# Patient Record
Sex: Female | Born: 1945 | Race: White | Hispanic: No | Smoking: Never smoker
Health system: Southern US, Community
[De-identification: ages and names within clinical notes are randomized; demographics above are authoritative.]

## PROBLEM LIST (undated history)

## (undated) DIAGNOSIS — I10 Essential (primary) hypertension: Secondary | ICD-10-CM

## (undated) DIAGNOSIS — M1990 Unspecified osteoarthritis, unspecified site: Secondary | ICD-10-CM

## (undated) DIAGNOSIS — F419 Anxiety disorder, unspecified: Secondary | ICD-10-CM

## (undated) DIAGNOSIS — N3010 Interstitial cystitis (chronic) without hematuria: Secondary | ICD-10-CM

## (undated) DIAGNOSIS — K219 Gastro-esophageal reflux disease without esophagitis: Secondary | ICD-10-CM

## (undated) DIAGNOSIS — I219 Acute myocardial infarction, unspecified: Secondary | ICD-10-CM

## (undated) DIAGNOSIS — R112 Nausea with vomiting, unspecified: Secondary | ICD-10-CM

## (undated) DIAGNOSIS — Z9889 Other specified postprocedural states: Secondary | ICD-10-CM

## (undated) HISTORY — PX: APPENDECTOMY: SHX54

## (undated) HISTORY — PX: TONSILLECTOMY: SUR1361

## (undated) HISTORY — DX: Essential (primary) hypertension: I10

## (undated) HISTORY — DX: Gastro-esophageal reflux disease without esophagitis: K21.9

## (undated) HISTORY — DX: Acute myocardial infarction, unspecified: I21.9

## (undated) HISTORY — DX: Interstitial cystitis (chronic) without hematuria: N30.10

## (undated) HISTORY — PX: CHOLECYSTECTOMY: SHX55

## (undated) HISTORY — DX: Anxiety disorder, unspecified: F41.9

## (undated) HISTORY — PX: OTHER SURGICAL HISTORY: SHX169

---

## 2007-04-25 HISTORY — PX: OTHER SURGICAL HISTORY: SHX169

## 2008-04-24 DIAGNOSIS — I219 Acute myocardial infarction, unspecified: Secondary | ICD-10-CM

## 2008-04-24 HISTORY — DX: Acute myocardial infarction, unspecified: I21.9

## 2009-06-23 ENCOUNTER — Ambulatory Visit (HOSPITAL_COMMUNITY): Admission: RE | Admit: 2009-06-23 | Discharge: 2009-06-23 | Payer: Self-pay | Admitting: Family Medicine

## 2012-10-02 ENCOUNTER — Other Ambulatory Visit (HOSPITAL_COMMUNITY): Payer: Self-pay | Admitting: Family Medicine

## 2012-10-02 DIAGNOSIS — R52 Pain, unspecified: Secondary | ICD-10-CM

## 2012-10-09 ENCOUNTER — Other Ambulatory Visit (HOSPITAL_COMMUNITY): Payer: Self-pay | Admitting: Family Medicine

## 2012-10-09 ENCOUNTER — Ambulatory Visit (HOSPITAL_COMMUNITY)
Admission: RE | Admit: 2012-10-09 | Discharge: 2012-10-09 | Disposition: A | Discharge status: Home / Self Care | Payer: Medicare Other | Source: Ambulatory Visit | Attending: Family Medicine | Admitting: Family Medicine

## 2012-10-09 DIAGNOSIS — R52 Pain, unspecified: Secondary | ICD-10-CM

## 2012-10-09 DIAGNOSIS — N644 Mastodynia: Secondary | ICD-10-CM | POA: Insufficient documentation

## 2012-10-09 DIAGNOSIS — M546 Pain in thoracic spine: Secondary | ICD-10-CM | POA: Insufficient documentation

## 2013-09-01 ENCOUNTER — Other Ambulatory Visit (HOSPITAL_COMMUNITY): Payer: Self-pay | Admitting: Internal Medicine

## 2013-09-01 DIAGNOSIS — Z1231 Encounter for screening mammogram for malignant neoplasm of breast: Secondary | ICD-10-CM

## 2013-10-10 ENCOUNTER — Ambulatory Visit (HOSPITAL_COMMUNITY)
Admission: RE | Admit: 2013-10-10 | Discharge: 2013-10-10 | Disposition: A | Discharge status: Home / Self Care | Payer: Medicare Other | Source: Ambulatory Visit | Attending: Internal Medicine | Admitting: Internal Medicine

## 2013-10-10 DIAGNOSIS — Z1231 Encounter for screening mammogram for malignant neoplasm of breast: Secondary | ICD-10-CM | POA: Insufficient documentation

## 2013-10-22 HISTORY — PX: COLONOSCOPY: SHX174

## 2014-08-25 ENCOUNTER — Other Ambulatory Visit (HOSPITAL_COMMUNITY): Payer: Self-pay | Admitting: Family

## 2014-08-25 DIAGNOSIS — Z1231 Encounter for screening mammogram for malignant neoplasm of breast: Secondary | ICD-10-CM

## 2014-10-12 ENCOUNTER — Ambulatory Visit (HOSPITAL_COMMUNITY)
Admission: RE | Admit: 2014-10-12 | Discharge: 2014-10-12 | Disposition: A | Discharge status: Home / Self Care | Payer: Medicare Other | Source: Ambulatory Visit | Attending: Family | Admitting: Family

## 2014-10-12 DIAGNOSIS — Z1231 Encounter for screening mammogram for malignant neoplasm of breast: Secondary | ICD-10-CM | POA: Diagnosis not present

## 2015-04-29 ENCOUNTER — Encounter: Payer: Self-pay | Admitting: Gastroenterology

## 2015-04-29 ENCOUNTER — Ambulatory Visit (INDEPENDENT_AMBULATORY_CARE_PROVIDER_SITE_OTHER): Payer: Medicare Other | Admitting: Gastroenterology

## 2015-04-29 VITALS — BP 159/76 | HR 61 | Temp 97.4°F | Ht 64.0 in | Wt 222.0 lb

## 2015-04-29 DIAGNOSIS — K625 Hemorrhage of anus and rectum: Secondary | ICD-10-CM

## 2015-04-29 NOTE — Progress Notes (Signed)
Please schedule colonoscopy +/- EGD with RMR. Dx: rectal bleeding, anorexia

## 2015-04-29 NOTE — Assessment & Plan Note (Signed)
70 year old female with recent moderate to large volume hematochezia without significant drop in her hemoglobin in the setting of aspirin, Mobic, Aleve, Elmiron. Clinically doing better at this time. Now on PPI. Stop Aleve and Elmiron until further workup. Suspect rectal bleeding related to benign anorectal source or diverticula but given multiple NSAIDs/aspirin cannot rule out upper GI etiology in setting of recent anorexia. Improvement of symptoms with discontinuing medications as outlined and adding PPI. Discussed with Dr. Jena Gaussourk. Recommend colonoscopy +/- EGD in near future.  I have discussed the risks, alternatives, benefits with regards to but not limited to the risk of reaction to medication, bleeding, infection, perforation and the patient is agreeable to proceed. Written consent to be obtained.  She will call if she has any further episodes of bleeding.

## 2015-04-29 NOTE — Patient Instructions (Signed)
1. I will touch base with Dr. Jena Gaussourk and let you know if he recommends colonoscopy and possible upper endoscopy. Further recommendations to follow.

## 2015-04-29 NOTE — Progress Notes (Signed)
Primary Care Physician:  Altamease OilerHARRIS,MEREDITH L, FNP  Primary Gastroenterologist:  Roetta SessionsMichael Rourk, MD   Chief Complaint  Patient presents with  . Rectal Bleeding    HPI:  Charlotte Woods is a 70 y.o. female here at the request of her PCP for further evaluation of rectal bleeding. Two weeks prior to Christmas she developed loss of appetite without any other symptoms. Denies abdominal pain. No heartburn, dysphagia, vomiting. Christmas day she had the urge to have a bowel movement. She passed a moderate to large amount of fresh blood. Evaluated at the emergency department. On digital rectal exam she was heme positive, brown stool. External hemorrhoid no bleeding. Nontender exam. Her vital signs were stable. Hemoglobin was 15. Platelet count 261,000, albumin 4, creatinine 0.81,, INR 1.1. Because she takes mobic, baby aspirin, Aleve bid, Elmiron she was started on pantoprazole empirically. Patient's last colonoscopy was in July 2015 by Dr. Halina MaidensJack Spainhour. She had sigmoid diverticulosis.  No further rectal bleeding. Denies melena. No abdominal pain. No constipation or diarrhea. Appetite improved at this point. She did stop Aleve and Elmiron. Her urologist asked her to stay off Elmiron until further workup because of heparin-like effects and potential for colitis related to medication.   Current Outpatient Prescriptions  Medication Sig Dispense Refill  . ELMIRON 100 MG capsule     . Glucosamine 500 MG CAPS Take 1,000 mg by mouth daily.    . hydrOXYzine (ATARAX/VISTARIL) 25 MG tablet     . lisinopril-hydrochlorothiazide (PRINZIDE,ZESTORETIC) 20-25 MG tablet     . meloxicam (MOBIC) 15 MG tablet     . Omega-3 Fatty Acids (FISH OIL) 1000 MG CPDR Take by mouth daily.    . pantoprazole (PROTONIX) 40 MG tablet      No current facility-administered medications for this visit.    Allergies as of 04/29/2015 - Review Complete 04/29/2015  Allergen Reaction Noted  . Codeine Other (See Comments) 04/29/2015  .  Penicillins Hives 04/29/2015  . Phenazopyridine Hives 04/29/2015  . Pravastatin Other (See Comments) 04/29/2015  . Septra [sulfamethoxazole-trimethoprim] Hives 04/29/2015  . Simvastatin Other (See Comments) 04/29/2015  . Zetia [ezetimibe] Other (See Comments) 04/29/2015    Past Medical History  Diagnosis Date  . Interstitial cystitis   . HTN (hypertension)   . GERD (gastroesophageal reflux disease)   . Heart attack [facility](H[facility]) 2010    [facility] Dr. Scotty CourtStafford, silent    Past Surgical History  Procedure Laterality Date  . Tonsillectomy    . Cholecystectomy    . Appendectomy    . Hysterectomy  2009    complete. endometrial cancer  . Carpel tunnel    . Colonoscopy  10/2013    Dr. Aleene DavidsonSpainhour: sigmoid diverticulosis    Family History  Problem Relation Age of Onset  . Colon cancer Neg Hx   . Endometrial cancer Mother     died old age, 692  . Lung cancer Father     Social History   Social History  . Marital Status: Divorced    Spouse Name: N/A  . Number of Children: 2  . Years of Education: N/A   Occupational History  . Not on file.   Social History Main Topics  . Smoking status: Never Smoker   . Smokeless tobacco: Not on file  . Alcohol Use: No  . Drug Use: No  . Sexual Activity: Not on file   Other Topics Concern  . Not on file   Social History Narrative  . No narrative on file  ROS:  General: Negative for anorexia, weight loss, fever, chills, fatigue, weakness. Eyes: Negative for vision changes.  ENT: Negative for hoarseness, difficulty swallowing , nasal congestion. CV: Negative for chest pain, angina, palpitations, dyspnea on exertion, peripheral edema.  Respiratory: Negative for dyspnea at rest, dyspnea on exertion, cough, sputum, wheezing.  GI: See history of present illness. GU:  Negative for dysuria, hematuria, urinary incontinence, urinary frequency, nocturnal urination.  MS: Negative for joint pain, low back pain.  Derm: Negative for rash or itching.   Neuro: Negative for weakness, abnormal sensation, seizure, frequent headaches, memory loss, confusion.  Psych: Negative for anxiety, depression, suicidal ideation, hallucinations.  Endo: Negative for unusual weight change.  Heme: Negative for bruising or bleeding. Allergy: Negative for rash or hives.    Physical Examination:  BP 159/76 mmHg  Pulse 61  Temp(Src) 97.4 F (36.3 C)  Ht  (1.626 m)  Wt 222 lb (100.699 kg)  BMI 38.09 kg/m2   General: Well-nourished, well-developed in no acute distress.  Head: Normocephalic, atraumatic.   Eyes: Conjunctiva pink, no icterus. Mouth: Oropharyngeal mucosa moist and pink , no lesions erythema or exudate. Neck: Supple without thyromegaly, masses, or lymphadenopathy.  Lungs: Clear to auscultation bilaterally.  Heart: Regular rate and rhythm, no murmurs rubs or gallops.  Abdomen: Bowel sounds are normal, nontender, nondistended, no hepatosplenomegaly or masses, no abdominal bruits or    hernia , no rebound or guarding.   Rectal: Not performed Extremities: No lower extremity edema. No clubbing or deformities.  Neuro: Alert and oriented x 4 , grossly normal neurologically.  Skin: Warm and dry, no rash or jaundice.   Psych: Alert and cooperative, normal mood and affect.  Labs: As above. In addition labs from 04/05/2015 hepatitis A IgM nonreactive, hepatitis B surface antigen nonreactive, hepatitis C antibody nonreactive, hepatitis B core IgM nonreactive, glucose 92, creatinine 0.86, sodium 140, total bilirubin 0.6, alkaline phosphatase 38, AST 19, ALT 27, albumin 4.2  Imaging Studies: No results found.

## 2015-04-29 NOTE — Progress Notes (Signed)
Routing to clinical pool to schedule.  

## 2015-04-30 NOTE — Progress Notes (Signed)
CC'ED TO PCP 

## 2015-05-03 ENCOUNTER — Other Ambulatory Visit: Payer: Self-pay

## 2015-05-03 DIAGNOSIS — R630 Anorexia: Secondary | ICD-10-CM

## 2015-05-03 DIAGNOSIS — K625 Hemorrhage of anus and rectum: Secondary | ICD-10-CM

## 2015-05-03 MED ORDER — PEG 3350-KCL-NA BICARB-NACL 420 G PO SOLR: 4000.0000 mL | Freq: Once | ORAL | Status: DC

## 2015-05-03 NOTE — Progress Notes (Signed)
Spoke with pt and she is set for procedure on 05/19/2015.  Instructions mailed to pt.

## 2015-05-19 ENCOUNTER — Encounter (HOSPITAL_COMMUNITY)
Admission: RE | Disposition: A | Discharge status: Home / Self Care | Payer: Self-pay | Source: Ambulatory Visit | Attending: Internal Medicine

## 2015-05-19 ENCOUNTER — Encounter (HOSPITAL_COMMUNITY): Payer: Self-pay | Admitting: *Deleted

## 2015-05-19 ENCOUNTER — Ambulatory Visit (HOSPITAL_COMMUNITY)
Admission: RE | Admit: 2015-05-19 | Discharge: 2015-05-19 | Disposition: A | Discharge status: Home / Self Care | Payer: Medicare Other | Source: Ambulatory Visit | Attending: Internal Medicine | Admitting: Internal Medicine

## 2015-05-19 DIAGNOSIS — K649 Unspecified hemorrhoids: Secondary | ICD-10-CM | POA: Insufficient documentation

## 2015-05-19 DIAGNOSIS — K648 Other hemorrhoids: Secondary | ICD-10-CM | POA: Insufficient documentation

## 2015-05-19 DIAGNOSIS — K625 Hemorrhage of anus and rectum: Secondary | ICD-10-CM | POA: Diagnosis not present

## 2015-05-19 DIAGNOSIS — K219 Gastro-esophageal reflux disease without esophagitis: Secondary | ICD-10-CM | POA: Insufficient documentation

## 2015-05-19 DIAGNOSIS — I10 Essential (primary) hypertension: Secondary | ICD-10-CM | POA: Insufficient documentation

## 2015-05-19 DIAGNOSIS — I252 Old myocardial infarction: Secondary | ICD-10-CM | POA: Insufficient documentation

## 2015-05-19 DIAGNOSIS — R630 Anorexia: Secondary | ICD-10-CM

## 2015-05-19 DIAGNOSIS — K5730 Diverticulosis of large intestine without perforation or abscess without bleeding: Secondary | ICD-10-CM | POA: Insufficient documentation

## 2015-05-19 DIAGNOSIS — Z79899 Other long term (current) drug therapy: Secondary | ICD-10-CM | POA: Insufficient documentation

## 2015-05-19 DIAGNOSIS — K921 Melena: Secondary | ICD-10-CM | POA: Diagnosis not present

## 2015-05-19 HISTORY — DX: Other specified postprocedural states: Z98.890

## 2015-05-19 HISTORY — DX: Nausea with vomiting, unspecified: R11.2

## 2015-05-19 HISTORY — PX: ESOPHAGOGASTRODUODENOSCOPY: SHX5428

## 2015-05-19 HISTORY — PX: COLONOSCOPY: SHX5424

## 2015-05-19 SURGERY — COLONOSCOPY
Anesthesia: Moderate Sedation

## 2015-05-19 MED ORDER — MEPERIDINE HCL 100 MG/ML IJ SOLN
INTRAMUSCULAR | Status: AC
  Filled 2015-05-19: qty 2

## 2015-05-19 MED ORDER — STERILE WATER FOR IRRIGATION IR SOLN
Status: DC | PRN
  Administered 2015-05-19: 09:00:00

## 2015-05-19 MED ORDER — LIDOCAINE VISCOUS 2 % MT SOLN
OROMUCOSAL | Status: AC
  Filled 2015-05-19: qty 15

## 2015-05-19 MED ORDER — LIDOCAINE VISCOUS 2 % MT SOLN
OROMUCOSAL | Status: DC | PRN
  Administered 2015-05-19: 5 mL via OROMUCOSAL

## 2015-05-19 MED ORDER — MIDAZOLAM HCL 5 MG/5ML IJ SOLN
INTRAMUSCULAR | Status: DC | PRN
  Administered 2015-05-19: 2 mg via INTRAVENOUS
  Administered 2015-05-19: 1 mg via INTRAVENOUS
  Administered 2015-05-19: 2 mg via INTRAVENOUS
  Administered 2015-05-19: 1 mg via INTRAVENOUS

## 2015-05-19 MED ORDER — SODIUM CHLORIDE 0.9 % IV SOLN
INTRAVENOUS | Status: DC
  Administered 2015-05-19: 1000 mL via INTRAVENOUS

## 2015-05-19 MED ORDER — MIDAZOLAM HCL 5 MG/5ML IJ SOLN
INTRAMUSCULAR | Status: AC
  Filled 2015-05-19: qty 10

## 2015-05-19 MED ORDER — ONDANSETRON HCL 4 MG/2ML IJ SOLN
INTRAMUSCULAR | Status: AC
  Filled 2015-05-19: qty 2

## 2015-05-19 MED ORDER — MEPERIDINE HCL 100 MG/ML IJ SOLN
INTRAMUSCULAR | Status: DC | PRN
  Administered 2015-05-19: 50 mg via INTRAVENOUS
  Administered 2015-05-19: 25 mg via INTRAVENOUS
  Administered 2015-05-19: 50 mg via INTRAVENOUS

## 2015-05-19 NOTE — Op Note (Signed)
[facility] [address] [place], [address]   ENDOSCOPY PROCEDURE REPORT  PATIENT: Charlotte Woods, Charlotte Woods  MR#: [id number] BIRTHDATE: 11/14/1945 , 69  yrs. old GENDER: female ENDOSCOPIST: R.  Roetta Sessions, MD FACP [facility] REFERRED BY:  Herbert Deaner, FNP PROCEDURE DATE:  05-24-2015 PROCEDURE:  EGD, diagnostic INDICATIONS:  Recent GI bleed; essentially negative colonoscopy. MEDICATIONS: Versed 6 mg IV and Demerol 125 mg IV in divided doses. Zofran 4 mg IV.  Xylocaine gel orally. ASA CLASS:      Class II  CONSENT: The risks, benefits, limitations, alternatives and imponderables have been discussed.  The potential for biopsy, esophogeal dilation, etc. have also been reviewed.  Questions have been answered.  All parties agreeable.  Please see the history and physical in the medical record for more information.  DESCRIPTION OF PROCEDURE: After the risks benefits and alternatives of the procedure were thoroughly explained, informed consent was obtained.  The EC-3890Li ([id number]) endoscope was introduced through the mouth and advanced to the second portion of the duodenum , limited by Without limitations. The instrument was slowly withdrawn as the mucosa was fully examined. Estimated blood loss is zero unless otherwise noted in this procedure report.    Normal-appearing tubular esophagus.  Stomach empty. Normal-appearing gastric mucosa.  Patent pylorus.  Normal-appearing first and second portion of the duodenum.  Retroflexed views revealed no abnormalities.     The scope was then withdrawn from the patient and the procedure completed.  COMPLICATIONS: There were no immediate complications.  ENDOSCOPIC IMPRESSION: Normal EGD. I suspect a relatively trivial GI bleed lower GI tract etiology  RECOMMENDATIONS: If patient experiences recurrent bleeding, could consider hemorrhoid banding.  I would resume Elmiron if urologist feels it would be of benefit. Would continue to  use Aleve or Mobic judiciously.  REPEAT EXAM:  eSigned:  R. Roetta Sessions, MD Jerrel Ivory [facility] 05/24/15 10:12 AM    CC:  CPT CODES: ICD CODES:  The ICD and CPT codes recommended by this software are interpretations from the data that the clinical staff has captured with the software.  The verification of the translation of this report to the ICD and CPT codes and modifiers is the sole responsibility of the health care institution and practicing physician where this report was generated.  [employer] will not be held responsible for the validity of the ICD and CPT codes included on this report.  AMA assumes no liability for data contained or not contained herein. CPT is a Publishing rights manager of the [employer].  PATIENT NAME:  [name], [name] MR#: [id number]

## 2015-05-19 NOTE — Discharge Instructions (Signed)
Colonoscopy Discharge Instructions  Read the instructions outlined below and refer to this sheet in the next few weeks. These discharge instructions provide you with general information on caring for yourself after you leave the hospital. Your doctor may also give you specific instructions. While your treatment has been planned according to the most current medical practices available, unavoidable complications occasionally occur. If you have any problems or questions after discharge, call Dr. Gala Romney at [phone]. ACTIVITY  You may resume your regular activity, but move at a slower pace for the next 24 hours.   Take frequent rest periods for the next 24 hours.   Walking will help get rid of the air and reduce the bloated feeling in your belly (abdomen).   No driving for 24 hours (because of the medicine (anesthesia) used during the test).    Do not sign any important legal documents or operate any machinery for 24 hours (because of the anesthesia used during the test).  NUTRITION  Drink plenty of fluids.   You may resume your normal diet as instructed by your doctor.   Begin with a light meal and progress to your normal diet. Heavy or fried foods are harder to digest and may make you feel sick to your stomach (nauseated).   Avoid alcoholic beverages for 24 hours or as instructed.  MEDICATIONS  You may resume your normal medications unless your doctor tells you otherwise.  WHAT YOU CAN EXPECT TODAY  Some feelings of bloating in the abdomen.   Passage of more gas than usual.   Spotting of blood in your stool or on the toilet paper.  IF YOU HAD POLYPS REMOVED DURING THE COLONOSCOPY:  No aspirin products for 7 days or as instructed.   No alcohol for 7 days or as instructed.   Eat a soft diet for the next 24 hours.  FINDING OUT THE RESULTS OF YOUR TEST Not all test results are available during your visit. If your test results are not back during the visit, make an appointment  with your caregiver to find out the results. Do not assume everything is normal if you have not heard from your caregiver or the medical facility. It is important for you to follow up on all of your test results.  SEEK IMMEDIATE MEDICAL ATTENTION IF:  You have more than a spotting of blood in your stool.   Your belly is swollen (abdominal distention).   You are nauseated or vomiting.   You have a temperature over 101.  You have abdominal pain or discomfort that is severe or gets worse throughout the day. EGD Discharge instructions Please read the instructions outlined below and refer to this sheet in the next few weeks. These discharge instructions provide you with general information on caring for yourself after you leave the hospital. Your doctor may also give you specific instructions. While your treatment has been planned according to the most current medical practices available, unavoidable complications occasionally occur. If you have any problems or questions after discharge, please call your doctor. ACTIVITY You may resume your regular activity but move at a slower pace for the next 24 hours.  Take frequent rest periods for the next 24 hours.  Walking will help expel (get rid of) the air and reduce the bloated feeling in your abdomen.  No driving for 24 hours (because of the anesthesia (medicine) used during the test).  You may shower.  Do not sign any important legal documents or operate any machinery for 24  hours (because of the anesthesia used during the test).  NUTRITION Drink plenty of fluids.  You may resume your normal diet.  Begin with a light meal and progress to your normal diet.  Avoid alcoholic beverages for 24 hours or as instructed by your caregiver.  MEDICATIONS You may resume your normal medications unless your caregiver tells you otherwise.  WHAT YOU CAN EXPECT TODAY You may experience abdominal discomfort such as a feeling of fullness or gas pains.   FOLLOW-UP Your doctor will discuss the results of your test with you.  SEEK IMMEDIATE MEDICAL ATTENTION IF ANY OF THE FOLLOWING OCCUR: Excessive nausea (feeling sick to your stomach) and/or vomiting.  Severe abdominal pain and distention (swelling).  Trouble swallowing.  Temperature over 101 F (37.8 C).  Rectal bleeding or vomiting of blood.    May resume Elmiron  Hemorrhoid and diverticulosis information provided  Trial of Benefiber 1 tablespoon twice daily  If bleeding recurs may benefit from hemorrhoid banding in the office    Diverticulosis Diverticulosis is the condition that develops when small pouches (diverticula) form in the wall of your colon. Your colon, or large intestine, is where water is absorbed and stool is formed. The pouches form when the inside layer of your colon pushes through weak spots in the outer layers of your colon. CAUSES  No one knows exactly what causes diverticulosis. RISK FACTORS  Being older than 50. Your risk for this condition increases with age. Diverticulosis is rare in people younger than 40 years. By age 44, almost everyone has it.  Eating a low-fiber diet.  Being frequently constipated.  Being overweight.  Not getting enough exercise.  Smoking.  Taking over-the-counter pain medicines, like aspirin and ibuprofen. SYMPTOMS  Most people with diverticulosis do not have symptoms. DIAGNOSIS  Because diverticulosis often has no symptoms, health care providers often discover the condition during an exam for other colon problems. In many cases, a health care provider will diagnose diverticulosis while using a flexible scope to examine the colon (colonoscopy). TREATMENT  If you have never developed an infection related to diverticulosis, you may not need treatment. If you have had an infection before, treatment may include:  Eating more fruits, vegetables, and grains.  Taking a fiber supplement.  Taking a live bacteria supplement  (probiotic).  Taking medicine to relax your colon. HOME CARE INSTRUCTIONS   Drink at least 6-8 glasses of water each day to prevent constipation.  Try not to strain when you have a bowel movement.  Keep all follow-up appointments. If you have had an infection before:  Increase the fiber in your diet as directed by your health care provider or dietitian.  Take a dietary fiber supplement if your health care provider approves.  Only take medicines as directed by your health care provider. SEEK MEDICAL CARE IF:   You have abdominal pain.  You have bloating.  You have cramps.  You have not gone to the bathroom in 3 days. SEEK IMMEDIATE MEDICAL CARE IF:   Your pain gets worse.  Yourbloating becomes very bad.  You have a fever or chills, and your symptoms suddenly get worse.  You begin vomiting.  You have bowel movements that are bloody or black. MAKE SURE YOU:  Understand these instructions.  Will watch your condition.  Will get help right away if you are not doing well or get worse.   This information is not intended to replace advice given to you by your health care provider. Make sure you  discuss any questions you have with your health care provider.   Document Released: 01/06/2004 Document Revised: 04/15/2013 Document Reviewed: 03/05/2013 Elsevier Interactive Patient Education 2016 [employer].   Hemorrhoids Hemorrhoids are swollen veins around the rectum or anus. There are two types of hemorrhoids:   Internal hemorrhoids. These occur in the veins just inside the rectum. They may poke through to the outside and become irritated and painful.  External hemorrhoids. These occur in the veins outside the anus and can be felt as a painful swelling or hard lump near the anus. CAUSES  Pregnancy.   Obesity.   Constipation or diarrhea.   Straining to have a bowel movement.   Sitting for long periods on the toilet.  Heavy lifting or other activity that  caused you to strain.  Anal intercourse. SYMPTOMS   Pain.   Anal itching or irritation.   Rectal bleeding.   Fecal leakage.   Anal swelling.   One or more lumps around the anus.  DIAGNOSIS  Your caregiver may be able to diagnose hemorrhoids by visual examination. Other examinations or tests that may be performed include:   Examination of the rectal area with a gloved hand (digital rectal exam).   Examination of anal canal using a small tube (scope).   A blood test if you have lost a significant amount of blood.  A test to look inside the colon (sigmoidoscopy or colonoscopy). TREATMENT Most hemorrhoids can be treated at home. However, if symptoms do not seem to be getting better or if you have a lot of rectal bleeding, your caregiver may perform a procedure to help make the hemorrhoids get smaller or remove them completely. Possible treatments include:   Placing a rubber band at the base of the hemorrhoid to cut off the circulation (rubber band ligation).   Injecting a chemical to shrink the hemorrhoid (sclerotherapy).   Using a tool to burn the hemorrhoid (infrared light therapy).   Surgically removing the hemorrhoid (hemorrhoidectomy).   Stapling the hemorrhoid to block blood flow to the tissue (hemorrhoid stapling).  HOME CARE INSTRUCTIONS   Eat foods with fiber, such as whole grains, beans, nuts, fruits, and vegetables. Ask your doctor about taking products with added fiber in them (fibersupplements).  Increase fluid intake. Drink enough water and fluids to keep your urine clear or pale yellow.   Exercise regularly.   Go to the bathroom when you have the urge to have a bowel movement. Do not wait.   Avoid straining to have bowel movements.   Keep the anal area dry and clean. Use wet toilet paper or moist towelettes after a bowel movement.   Medicated creams and suppositories may be used or applied as directed.   Only take over-the-counter  or prescription medicines as directed by your caregiver.   Take warm sitz baths for 15-20 minutes, 3-4 times a day to ease pain and discomfort.   Place ice packs on the hemorrhoids if they are tender and swollen. Using ice packs between sitz baths may be helpful.   Put ice in a plastic bag.   Place a towel between your skin and the bag.   Leave the ice on for 15-20 minutes, 3-4 times a day.   Do not use a donut-shaped pillow or sit on the toilet for long periods. This increases blood pooling and pain.  SEEK MEDICAL CARE IF:  You have increasing pain and swelling that is not controlled by treatment or medicine.  You have uncontrolled bleeding.  You have difficulty or you are unable to have a bowel movement.  You have pain or inflammation outside the area of the hemorrhoids. MAKE SURE YOU:  Understand these instructions.  Will watch your condition.  Will get help right away if you are not doing well or get worse.   This information is not intended to replace advice given to you by your health care provider. Make sure you discuss any questions you have with your health care provider.   Document Released: 04/07/2000 Document Revised: 03/27/2012 Document Reviewed: 02/13/2012 Elsevier Interactive Patient Education [employer]

## 2015-05-19 NOTE — Op Note (Addendum)
[facility] [address] [place], [address]   COLONOSCOPY PROCEDURE REPORT  PATIENT: Charlotte Woods, Charlotte Woods  MR#: [id number] BIRTHDATE: Oct 18, 1945 , 69  yrs. old GENDER: female ENDOSCOPIST: R.  Roetta Sessions, MD FACP [facility] REFERRED BY:     Herbert Deaner, FNP PROCEDURE DATE:  05-31-2015 PROCEDURE:   Colonoscopy, diagnostic INDICATIONS:Self-limiting hematochezia. MEDICATIONS: Versed 4 mg IV and Demerol 100 mg IV in divided doses. Zofran 4 mg IV. ASA CLASS:       Class II  CONSENT: The risks, benefits, alternatives and imponderables including but not limited to bleeding, perforation as well as the possibility of a missed lesion have been reviewed.  The potential for biopsy, lesion removal, etc. have also been discussed. Questions have been answered.  All parties agreeable.  Please see the history and physical in the medical record for more information.  DESCRIPTION OF PROCEDURE:   After the risks benefits and alternatives of the procedure were thoroughly explained, informed consent was obtained.  The digital rectal exam revealed no abnormalities of the rectum.   The EC-3890Li ([id number])  endoscope was introduced through the anus and advanced to the terminal ileum which was intubated for a short distance. No adverse events experienced.   The quality of the prep was adequate  The instrument was then slowly withdrawn as the colon was fully examined. Estimated blood loss is zero unless otherwise noted in this procedure report.      COLON FINDINGS: Prominent internal hemorrhoids and anal papilla; otherwise, normal-appearing rectal mucosa.  Scattered left-sided diverticula; the remainder of the colonic mucosa appeared normal. The distal 5 cm of terminal ileum mucosa also appeared normal. Retroflexion was performed. .  Withdrawal time=8 minutes 0 seconds.  The scope was withdrawn and the procedure completed. COMPLICATIONS: There were no immediate  complications.  ENDOSCOPIC IMPRESSION: Internal hemorrhoids. Colonic diverticulosis. Patient may have experienced either a  hemorrhoidal or diverticular bleed.  RECOMMENDATIONS: Consider Benefiber 1 tablespoon twice daily. See EGD report.  eSigned:  R. Roetta Sessions, MD Jerrel Ivory [facility] 2015/05/31 9:52 AM   cc:  CPT CODES: ICD CODES:  The ICD and CPT codes recommended by this software are interpretations from the data that the clinical staff has captured with the software.  The verification of the translation of this report to the ICD and CPT codes and modifiers is the sole responsibility of the health care institution and practicing physician where this report was generated.  [employer] will not be held responsible for the validity of the ICD and CPT codes included on this report.  AMA assumes no liability for data contained or not contained herein. CPT is a Publishing rights manager of the [employer].

## 2015-05-19 NOTE — H&P (View-Only) (Signed)
Primary Care Physician:  Altamease OilerHARRIS,MEREDITH L, FNP  Primary Gastroenterologist:  Roetta SessionsMichael Rourk, MD   Chief Complaint  Patient presents with  . Rectal Bleeding    HPI:  Charlotte Woods is a 70 y.o. female here at the request of her PCP for further evaluation of rectal bleeding. Two weeks prior to Christmas she developed loss of appetite without any other symptoms. Denies abdominal pain. No heartburn, dysphagia, vomiting. Christmas day she had the urge to have a bowel movement. She passed a moderate to large amount of fresh blood. Evaluated at the emergency department. On digital rectal exam she was heme positive, brown stool. External hemorrhoid no bleeding. Nontender exam. Her vital signs were stable. Hemoglobin was 15. Platelet count 261,000, albumin 4, creatinine 0.81,, INR 1.1. Because she takes mobic, baby aspirin, Aleve bid, Elmiron she was started on pantoprazole empirically. Patient's last colonoscopy was in July 2015 by Dr. Halina MaidensJack Spainhour. She had sigmoid diverticulosis.  No further rectal bleeding. Denies melena. No abdominal pain. No constipation or diarrhea. Appetite improved at this point. She did stop Aleve and Elmiron. Her urologist asked her to stay off Elmiron until further workup because of heparin-like effects and potential for colitis related to medication.   Current Outpatient Prescriptions  Medication Sig Dispense Refill  . ELMIRON 100 MG capsule     . Glucosamine 500 MG CAPS Take 1,000 mg by mouth daily.    . hydrOXYzine (ATARAX/VISTARIL) 25 MG tablet     . lisinopril-hydrochlorothiazide (PRINZIDE,ZESTORETIC) 20-25 MG tablet     . meloxicam (MOBIC) 15 MG tablet     . Omega-3 Fatty Acids (FISH OIL) 1000 MG CPDR Take by mouth daily.    . pantoprazole (PROTONIX) 40 MG tablet      No current facility-administered medications for this visit.    Allergies as of 04/29/2015 - Review Complete 04/29/2015  Allergen Reaction Noted  . Codeine Other (See Comments) 04/29/2015  .  Penicillins Hives 04/29/2015  . Phenazopyridine Hives 04/29/2015  . Pravastatin Other (See Comments) 04/29/2015  . Septra [sulfamethoxazole-trimethoprim] Hives 04/29/2015  . Simvastatin Other (See Comments) 04/29/2015  . Zetia [ezetimibe] Other (See Comments) 04/29/2015    Past Medical History  Diagnosis Date  . Interstitial cystitis   . HTN (hypertension)   . GERD (gastroesophageal reflux disease)   . Heart attack [facility]) 2010    [facility] Dr. Scotty CourtStafford, silent    Past Surgical History  Procedure Laterality Date  . Tonsillectomy    . Cholecystectomy    . Appendectomy    . Hysterectomy  2009    complete. endometrial cancer  . Carpel tunnel    . Colonoscopy  10/2013    Dr. Aleene DavidsonSpainhour: sigmoid diverticulosis    Family History  Problem Relation Age of Onset  . Colon cancer Neg Hx   . Endometrial cancer Mother     died old age, 692  . Lung cancer Father     Social History   Social History  . Marital Status: Divorced    Spouse Name: N/A  . Number of Children: 2  . Years of Education: N/A   Occupational History  . Not on file.   Social History Main Topics  . Smoking status: Never Smoker   . Smokeless tobacco: Not on file  . Alcohol Use: No  . Drug Use: No  . Sexual Activity: Not on file   Other Topics Concern  . Not on file   Social History Narrative  . No narrative on file  ROS:  General: Negative for anorexia, weight loss, fever, chills, fatigue, weakness. Eyes: Negative for vision changes.  ENT: Negative for hoarseness, difficulty swallowing , nasal congestion. CV: Negative for chest pain, angina, palpitations, dyspnea on exertion, peripheral edema.  Respiratory: Negative for dyspnea at rest, dyspnea on exertion, cough, sputum, wheezing.  GI: See history of present illness. GU:  Negative for dysuria, hematuria, urinary incontinence, urinary frequency, nocturnal urination.  MS: Negative for joint pain, low back pain.  Derm: Negative for rash or itching.   Neuro: Negative for weakness, abnormal sensation, seizure, frequent headaches, memory loss, confusion.  Psych: Negative for anxiety, depression, suicidal ideation, hallucinations.  Endo: Negative for unusual weight change.  Heme: Negative for bruising or bleeding. Allergy: Negative for rash or hives.    Physical Examination:  BP 159/76 mmHg  Pulse 61  Temp(Src) 97.4 F (36.3 C)  Ht  (1.626 m)  Wt 222 lb (100.699 kg)  BMI 38.09 kg/m2   General: Well-nourished, well-developed in no acute distress.  Head: Normocephalic, atraumatic.   Eyes: Conjunctiva pink, no icterus. Mouth: Oropharyngeal mucosa moist and pink , no lesions erythema or exudate. Neck: Supple without thyromegaly, masses, or lymphadenopathy.  Lungs: Clear to auscultation bilaterally.  Heart: Regular rate and rhythm, no murmurs rubs or gallops.  Abdomen: Bowel sounds are normal, nontender, nondistended, no hepatosplenomegaly or masses, no abdominal bruits or    hernia , no rebound or guarding.   Rectal: Not performed Extremities: No lower extremity edema. No clubbing or deformities.  Neuro: Alert and oriented x 4 , grossly normal neurologically.  Skin: Warm and dry, no rash or jaundice.   Psych: Alert and cooperative, normal mood and affect.  Labs: As above. In addition labs from 04/05/2015 hepatitis A IgM nonreactive, hepatitis B surface antigen nonreactive, hepatitis C antibody nonreactive, hepatitis B core IgM nonreactive, glucose 92, creatinine 0.86, sodium 140, total bilirubin 0.6, alkaline phosphatase 38, AST 19, ALT 27, albumin 4.2  Imaging Studies: No results found.

## 2015-05-19 NOTE — Interval H&P Note (Signed)
History and Physical Interval Note:  05/19/2015 9:19 AM  Charlotte Woods  has presented today for surgery, with the diagnosis of rectal bleeding, anorexia  The various methods of treatment have been discussed with the patient and family. After consideration of risks, benefits and other options for treatment, the patient has consented to  Procedure(s) with comments: COLONOSCOPY (N/A) - 0915 - moved to 9:30 - office to notify ESOPHAGOGASTRODUODENOSCOPY (EGD) (N/A) as a surgical intervention .  The patient's history has been reviewed, patient examined, no change in status, stable for surgery.  I have reviewed the patient's chart and labs.  Questions were answered to the patient's satisfaction.     Charlotte Woods  No change. Colonoscopy and possible EGD to follow per plan.  The risks, benefits, limitations, imponderables and alternatives regarding both EGD and colonoscopy have been reviewed with the patient. Questions have been answered. All parties agreeable.

## 2015-05-21 ENCOUNTER — Encounter (HOSPITAL_COMMUNITY): Payer: Self-pay | Admitting: Internal Medicine

## 2015-11-03 ENCOUNTER — Telehealth: Payer: Self-pay | Admitting: Internal Medicine

## 2015-11-03 NOTE — Telephone Encounter (Signed)
Cheri GuppyKim Shomali [facility] called today asking when patient's next colonoscopy is due. She had one in January 2017 but didn't know when she needed to have another. Please advise and I will call the pcp back to make them aware. [phone]

## 2015-11-03 NOTE — Telephone Encounter (Signed)
PCP is aware and reminder in epic

## 2015-11-03 NOTE — Telephone Encounter (Signed)
Routing to Susan. 

## 2015-11-03 NOTE — Telephone Encounter (Signed)
1 more for screening purposes in 10 years assuming no new interim problems develop

## 2015-11-03 NOTE — Telephone Encounter (Signed)
Dr.Rourk- when do you want this pt to have another tcs?

## 2015-11-11 ENCOUNTER — Other Ambulatory Visit (HOSPITAL_COMMUNITY): Payer: Self-pay | Admitting: Family

## 2015-11-11 DIAGNOSIS — Z1231 Encounter for screening mammogram for malignant neoplasm of breast: Secondary | ICD-10-CM

## 2015-11-17 ENCOUNTER — Ambulatory Visit (HOSPITAL_COMMUNITY): Payer: Medicare Other

## 2015-12-02 ENCOUNTER — Ambulatory Visit (HOSPITAL_COMMUNITY)
Admission: RE | Admit: 2015-12-02 | Discharge: 2015-12-02 | Disposition: A | Discharge status: Home / Self Care | Payer: Medicare Other | Source: Ambulatory Visit | Attending: Family | Admitting: Family

## 2015-12-02 DIAGNOSIS — Z1231 Encounter for screening mammogram for malignant neoplasm of breast: Secondary | ICD-10-CM | POA: Insufficient documentation

## 2016-01-31 ENCOUNTER — Ambulatory Visit: Payer: Medicare Other | Admitting: Orthopedic Surgery

## 2016-02-01 ENCOUNTER — Ambulatory Visit (INDEPENDENT_AMBULATORY_CARE_PROVIDER_SITE_OTHER): Payer: Medicare Other | Admitting: Orthopedic Surgery

## 2016-02-01 ENCOUNTER — Encounter: Payer: Self-pay | Admitting: Orthopedic Surgery

## 2016-02-01 VITALS — BP 132/72 | HR 74 | Ht 63.5 in | Wt 207.0 lb

## 2016-02-01 DIAGNOSIS — M75122 Complete rotator cuff tear or rupture of left shoulder, not specified as traumatic: Secondary | ICD-10-CM | POA: Diagnosis not present

## 2016-02-01 NOTE — Progress Notes (Signed)
Chief Complaint  Patient presents with  . New Patient (Initial Visit)    Left shoulder pain   HPI   70 year old female in her normal state of health. She fell in mid-August injured her right knee left shoulder and presents now with inability to raise her left arm. She thought it would get better as her knee got better but the shoulder has persisted with 10 out of 10 stabbing aching pain catching weakness and giving way. She has taken meloxicam for her osteoarthritis but no pain medicine for her shoulder. It keeps her up at night she has trouble laying on her right side although she can lift it up with her other hand she cannot maintain an extended flexed position and when she lowers the shoulder it gives way and collapses.  Review of Systems  Genitourinary:       Nocturia  Musculoskeletal: Positive for myalgias.  Psychiatric/Behavioral: The patient is nervous/anxious.   All other systems reviewed and are negative.   Past Medical History:  Diagnosis Date  . GERD (gastroesophageal reflux disease)   . Heart attack 2010   [facility] Dr. Scotty Court, silent  . HTN (hypertension)   . Interstitial cystitis   . PONV (postoperative nausea and vomiting)     Past Surgical History:  Procedure Laterality Date  . APPENDECTOMY    . carpel tunnel    . CHOLECYSTECTOMY    . COLONOSCOPY  10/2013   Dr. Aleene Davidson: sigmoid diverticulosis  . COLONOSCOPY N/A 05/19/2015   Procedure: COLONOSCOPY;  Surgeon: Corbin Ade, MD;  Location: [facility];  Service: Endoscopy;  Laterality: N/A;  0915 - moved to 9:30 - office to notify  . ESOPHAGOGASTRODUODENOSCOPY N/A 05/19/2015   Procedure: ESOPHAGOGASTRODUODENOSCOPY (EGD);  Surgeon: Corbin Ade, MD;  Location: [facility];  Service: Endoscopy;  Laterality: N/A;  . hysterectomy  2009   complete. endometrial cancer  . TONSILLECTOMY     Family History  Problem Relation Age of Onset  . Endometrial cancer Mother     died old age, 84  . Lung cancer Father   .  Colon cancer Neg Hx    Social History  Substance Use Topics  . Smoking status: Never Smoker  . Smokeless tobacco: Never Used  . Alcohol use No   Current Meds  Medication Sig  . aspirin 81 MG tablet Take 81 mg by mouth daily.  Marland Kitchen ELMIRON 100 MG capsule Reported on 05/12/2015  . Glucosamine 500 MG CAPS Take 1,000 mg by mouth daily.  . hydrOXYzine (ATARAX/VISTARIL) 25 MG tablet   . lisinopril-hydrochlorothiazide (PRINZIDE,ZESTORETIC) 20-25 MG tablet   . meloxicam (MOBIC) 15 MG tablet   . Omega-3 Fatty Acids (FISH OIL) 1000 MG CPDR Take by mouth daily.  . Probiotic Product (PROBIOTIC DAILY PO) Take by mouth.    BP 132/72   Pulse 74   Ht 5' 3.5" (1.613 m)   Wt 207 lb (93.9 kg)   BMI 36.09 kg/m   Physical Exam  Constitutional: She is oriented to person, place, and time. She appears well-developed and well-nourished. No distress.  Cardiovascular: Normal rate and intact distal pulses.   Neurological: She is alert and oriented to person, place, and time. She has normal reflexes. She exhibits normal muscle tone. Coordination normal.  Skin: Skin is warm and dry. No rash noted. She is not diaphoretic. No erythema. No pallor.  Psychiatric: She has a normal mood and affect. Her behavior is normal. Judgment and thought content normal.    Ortho Exam  The right shoulder has complete full range of motion normal strength in the rotator cuff. She has no tenderness or swelling and normal muscle tone and no instability  On the left shoulder has a positive drop test for rotator cuff tear. She has giving way when the arm is released from forward elevation. She has severe pain on range of motion tenderness in the rotator interval and lateral deltoid. There is no deformity and is no dislocation. Passive range of motion is normal. She has obvious weakness in the supraspinatus tendon though the infraspinatus and neck internal rotators are normal  Axillary lymph nodes are negative ASSESSMENT: My personal  interpretation of the images:  Plain films were from the primary care doctor's office it was in internal and A rotation view of the shoulder it showed before meals joint arthritis normal glenohumeral joint. No fracture   No diagnosis found.  PLAN Recommend rotator cuff tear to diagnose repairable rotator cuff tear  Start tramadol 50 mg every 6  Fuller CanadaStanley Harrison, MD 02/01/2016 4:00 PM  .meds

## 2016-02-07 ENCOUNTER — Ambulatory Visit (HOSPITAL_COMMUNITY)
Admission: RE | Admit: 2016-02-07 | Discharge: 2016-02-07 | Disposition: A | Discharge status: Home / Self Care | Payer: Medicare Other | Source: Ambulatory Visit | Attending: Orthopedic Surgery | Admitting: Orthopedic Surgery

## 2016-02-07 ENCOUNTER — Ambulatory Visit (HOSPITAL_COMMUNITY): Payer: Medicare Other

## 2016-02-07 DIAGNOSIS — M19012 Primary osteoarthritis, left shoulder: Secondary | ICD-10-CM | POA: Insufficient documentation

## 2016-02-07 DIAGNOSIS — M7552 Bursitis of left shoulder: Secondary | ICD-10-CM | POA: Diagnosis not present

## 2016-02-07 DIAGNOSIS — M75122 Complete rotator cuff tear or rupture of left shoulder, not specified as traumatic: Secondary | ICD-10-CM

## 2016-02-08 ENCOUNTER — Ambulatory Visit (INDEPENDENT_AMBULATORY_CARE_PROVIDER_SITE_OTHER): Payer: Medicare Other | Admitting: Orthopedic Surgery

## 2016-02-08 DIAGNOSIS — M75102 Unspecified rotator cuff tear or rupture of left shoulder, not specified as traumatic: Secondary | ICD-10-CM | POA: Diagnosis not present

## 2016-02-08 MED ORDER — TRAMADOL HCL 50 MG PO TABS: 50.0000 mg | ORAL_TABLET | Freq: Four times a day (QID) | ORAL | 0 refills | Status: DC | PRN

## 2016-02-08 NOTE — Patient Instructions (Addendum)
You have received an injection of steroids into the joint. 15% of patients will have increased pain within the 24 hours postinjection.   This is transient and will go away.   We recommend that you use ice packs on the injection site for 20 minutes every 2 hours and extra strength Tylenol 2 tablets every 8 as needed until the pain resolves.  If you continue to have pain after taking the Tylenol and using the ice please call the office for further instructions.    Impingement Syndrome, Rotator Cuff, Bursitis With Rehab Impingement syndrome is a condition that involves inflammation of the tendons of the rotator cuff and the subacromial bursa, that causes pain in the shoulder. The rotator cuff consists of four tendons and muscles that control much of the shoulder and upper arm function. The subacromial bursa is a fluid filled sac that helps reduce friction between the rotator cuff and one of the bones of the shoulder (acromion). Impingement syndrome is usually an overuse injury that causes swelling of the bursa (bursitis), swelling of the tendon (tendonitis), and/or a tear of the tendon (strain). Strains are classified into three categories. Grade 1 strains cause pain, but the tendon is not lengthened. Grade 2 strains include a lengthened ligament, due to the ligament being stretched or partially ruptured. With grade 2 strains there is still function, although the function may be decreased. Grade 3 strains include a complete tear of the tendon or muscle, and function is usually impaired. SYMPTOMS   Pain around the shoulder, often at the outer portion of the upper arm.  Pain that gets worse with shoulder function, especially when reaching overhead or lifting.  Sometimes, aching when not using the arm.  Pain that wakes you up at night.  Sometimes, tenderness, swelling, warmth, or redness over the affected area.  Loss of strength.  Limited motion of the shoulder, especially reaching behind the  back (to the back pocket or to unhook bra) or across your body.  Crackling sound (crepitation) when moving the arm.  Biceps tendon pain and inflammation (in the front of the shoulder). Worse when bending the elbow or lifting. CAUSES  Impingement syndrome is often an overuse injury, in which chronic (repetitive) motions cause the tendons or bursa to become inflamed. A strain occurs when a force is paced on the tendon or muscle that is greater than it can withstand. Common mechanisms of injury include: Stress from sudden increase in duration, frequency, or intensity of training.  Direct hit (trauma) to the shoulder.  Aging, erosion of the tendon with normal use.  Bony bump on shoulder (acromial spur). RISK INCREASES WITH:  Contact sports (football, wrestling, boxing).  Throwing sports (baseball, tennis, volleyball).  Weightlifting and bodybuilding.  Heavy labor.  Previous injury to the rotator cuff, including impingement.  Poor shoulder strength and flexibility.  Failure to warm up properly before activity.  Inadequate protective equipment.  Old age.  Bony bump on shoulder (acromial spur). PREVENTION   Warm up and stretch properly before activity.  Allow for adequate recovery between workouts.  Maintain physical fitness:  Strength, flexibility, and endurance.  Cardiovascular fitness.  Learn and use proper exercise technique. PROGNOSIS  If treated properly, impingement syndrome usually goes away within 6 weeks. Sometimes surgery is required.  RELATED COMPLICATIONS   Longer healing time if not properly treated, or if not given enough time to heal.  Recurring symptoms, that result in a chronic condition.  Shoulder stiffness, frozen shoulder, or loss of motion.  Rotator  cuff tendon tear.  Recurring symptoms, especially if activity is resumed too soon, with overuse, with a direct blow, or when using poor technique. TREATMENT  Treatment first involves the use of  ice and medicine, to reduce pain and inflammation. The use of strengthening and stretching exercises may help reduce pain with activity. These exercises may be performed at home or with a therapist. If non-surgical treatment is unsuccessful after more than 6 months, surgery may be advised. After surgery and rehabilitation, activity is usually possible in 3 months.  MEDICATION  If pain medicine is needed, nonsteroidal anti-inflammatory medicines (aspirin and ibuprofen), or other minor pain relievers (acetaminophen), are often advised.  Do not take pain medicine for 7 days before surgery.  Prescription pain relievers may be given, if your caregiver thinks they are needed. Use only as directed and only as much as you need.  Corticosteroid injections may be given by your caregiver. These injections should be reserved for the most serious cases, because they may only be given a certain number of times. HEAT AND COLD  Cold treatment (icing) should be applied for 10 to 15 minutes every 2 to 3 hours for inflammation and pain, and immediately after activity that aggravates your symptoms. Use ice packs or an ice massage.  Heat treatment may be used before performing stretching and strengthening activities prescribed by your caregiver, physical therapist, or athletic trainer. Use a heat pack or a warm water soak. SEEK MEDICAL CARE IF:   Symptoms get worse or do not improve in 4 to 6 weeks, despite treatment.  New, unexplained symptoms develop. (Drugs used in treatment may produce side effects.)

## 2016-02-08 NOTE — Progress Notes (Signed)
Patient ID: Charlotte Woods, female   DOB: 04-02-1946, 70 y.o.   MRN: [id number]  Chief Complaint  Patient presents with  . Follow-up    MRI results of left shoulder.    HPI Charlotte Woods is a 70 y.o. female.   HPI  70 years old status post fall an MRI showed tendinitis without tear before meals joint arthritis and glenohumeral arthritis  Prior treatment includes meloxicam for the hip and no other injection or therapy  Meloxicam did not help the shoulder pain tramadol did help a little bit    Review of Systems Review of Systems  Musculoskeletal: Positive for arthralgias. Negative for neck pain.     Physical Exam There were no vitals taken for this visit.   Physical Exam  MRI I reviewed the images  The MRI does not show any rotator cuff tear but lots of inflammation tendinopathy and arthritis  This is the MRI report IMPRESSION: 1. Prominent supraspinatus, infraspinatus, and subscapularis tendinopathy without a definite full-thickness tear identified. Moderate biceps tendinopathy. 2. Subacromial subdeltoid bursitis. 3. Moderate to prominent degenerative glenohumeral arthropathy with moderate AC joint arthropathy. 4. Despite efforts by the technologist and patient, motion artifact is present on today's exam and could not be eliminated. This reduces exam sensitivity and specificity.     Electronically Signed   By: Gaylyn RongWalter  Liebkemann M.D.   On: 02/08/2016 09:24  Procedure note the subacromial injection shoulder left   Verbal consent was obtained to inject the  Left   Shoulder  Timeout was completed to confirm the injection site is a subacromial space of the  left  shoulder  Medication used Depo-Medrol 40 mg and lidocaine 1% 3 cc  Anesthesia was provided by ethyl chloride  The injection was performed in the left  posterior subacromial space. After pinning the skin with alcohol and anesthetized the skin with ethyl chloride the subacromial space was injected using a  20-gauge needle. There were no complications  Sterile dressing was applied.  Physical therapy will be ordered  You tramadol which was refilled  Encounter Diagnosis  Name Primary?  . Rotator cuff syndrome of left shoulder Yes    Return in 2 months

## 2016-04-10 ENCOUNTER — Encounter: Payer: Self-pay | Admitting: Orthopedic Surgery

## 2016-04-10 ENCOUNTER — Ambulatory Visit (INDEPENDENT_AMBULATORY_CARE_PROVIDER_SITE_OTHER): Payer: Medicare HMO | Admitting: Orthopedic Surgery

## 2016-04-10 DIAGNOSIS — M75102 Unspecified rotator cuff tear or rupture of left shoulder, not specified as traumatic: Secondary | ICD-10-CM | POA: Diagnosis not present

## 2016-04-10 DIAGNOSIS — M5136 Other intervertebral disc degeneration, lumbar region: Secondary | ICD-10-CM

## 2016-04-10 MED ORDER — MELOXICAM 15 MG PO TABS: 15.0000 mg | ORAL_TABLET | Freq: Every day | ORAL | 5 refills | Status: DC

## 2016-04-10 NOTE — Progress Notes (Signed)
Patient ID: Raynaldo OpitzKay C Majerus, female   DOB: Mar 29, 1946, 70 y.o.   MRN: [id number]  Chief Complaint  Patient presents with  . Follow-up    LEFT ROTATOR CUFF SYNDROME    HPI Raynaldo OpitzKay C Libman is a 70 y.o. female.   HPI  Follow-up for left rotator cuff syndrome. Patient's MRI showed no rotator cuff tear with acromioclavicular arthrosis  She went to therapy she reports significant improvement    Review of Systems Review of Systems  Patient reports multiple falls but they were secondary to tripping   Physical Exam  Constitutional: She is oriented to person, place, and time. She appears well-developed and well-nourished. No distress.  Cardiovascular: Normal rate and intact distal pulses.   Neurological: She is alert and oriented to person, place, and time. She has normal reflexes. She exhibits normal muscle tone. Coordination normal.  Skin: Skin is warm and dry. No rash noted. She is not diaphoretic. No erythema. No pallor.  Psychiatric: She has a normal mood and affect. Her behavior is normal. Judgment and thought content normal.    Right shoulder internal rotation thoracic level VII left shoulder internal rotation back pocket  Rotator cuff strength mild weakness in the supraspinatus  Cross chest abduction test for A C joint normal   MEDICAL DECISION MAKING  DATA   MRI was reviewed again   DIAGNOSIS  Encounter Diagnoses  Name Primary?  . Rotator cuff syndrome of left shoulder   . DDD (degenerative disc disease), lumbar Yes     PLAN(RISK)    I did place her or reorder her meloxicam which she takes for her back  Continue home exercises follow-up if shoulder gets worse

## 2016-04-10 NOTE — Addendum Note (Signed)
Addended by: Adella HareBOOTHE, JAIME B on: 04/10/2016 11:32 AM   Modules accepted: Orders

## 2017-06-13 ENCOUNTER — Other Ambulatory Visit (HOSPITAL_COMMUNITY): Payer: Self-pay | Admitting: Family

## 2017-06-13 DIAGNOSIS — Z1231 Encounter for screening mammogram for malignant neoplasm of breast: Secondary | ICD-10-CM

## 2017-06-28 ENCOUNTER — Ambulatory Visit (HOSPITAL_COMMUNITY)
Admission: RE | Admit: 2017-06-28 | Discharge: 2017-06-28 | Disposition: A | Discharge status: Home / Self Care | Payer: Medicare Other | Source: Ambulatory Visit | Attending: Family | Admitting: Family

## 2017-06-28 DIAGNOSIS — Z1231 Encounter for screening mammogram for malignant neoplasm of breast: Secondary | ICD-10-CM | POA: Diagnosis not present

## 2017-11-16 ENCOUNTER — Emergency Department (HOSPITAL_COMMUNITY)
Admission: EM | Admit: 2017-11-16 | Discharge: 2017-11-16 | Disposition: A | Discharge status: Home / Self Care | Payer: Medicare Other | Attending: Emergency Medicine | Admitting: Emergency Medicine

## 2017-11-16 ENCOUNTER — Other Ambulatory Visit: Payer: Self-pay

## 2017-11-16 DIAGNOSIS — Z7982 Long term (current) use of aspirin: Secondary | ICD-10-CM | POA: Insufficient documentation

## 2017-11-16 DIAGNOSIS — Z79899 Other long term (current) drug therapy: Secondary | ICD-10-CM | POA: Diagnosis not present

## 2017-11-16 DIAGNOSIS — R748 Abnormal levels of other serum enzymes: Secondary | ICD-10-CM | POA: Diagnosis not present

## 2017-11-16 DIAGNOSIS — R531 Weakness: Secondary | ICD-10-CM | POA: Insufficient documentation

## 2017-11-16 DIAGNOSIS — I10 Essential (primary) hypertension: Secondary | ICD-10-CM | POA: Insufficient documentation

## 2017-11-16 LAB — URINALYSIS, ROUTINE W REFLEX MICROSCOPIC
Bilirubin Urine: NEGATIVE
GLUCOSE, UA: NEGATIVE mg/dL
Hgb urine dipstick: NEGATIVE
KETONES UR: NEGATIVE mg/dL
Leukocytes, UA: NEGATIVE
Nitrite: NEGATIVE
Protein, ur: NEGATIVE mg/dL
Specific Gravity, Urine: 1.005 (ref 1.005–1.030)
pH: 6 (ref 5.0–8.0)

## 2017-11-16 LAB — COMPREHENSIVE METABOLIC PANEL
ALT: 23 U/L (ref 0–44)
ANION GAP: 8 (ref 5–15)
AST: 20 U/L (ref 15–41)
Albumin: 3.8 g/dL (ref 3.5–5.0)
Alkaline Phosphatase: 29 U/L — ABNORMAL LOW (ref 38–126)
BUN: 27 mg/dL — AB (ref 8–23)
CHLORIDE: 104 mmol/L (ref 98–111)
CO2: 29 mmol/L (ref 22–32)
Calcium: 10.4 mg/dL — ABNORMAL HIGH (ref 8.9–10.3)
Creatinine, Ser: 0.85 mg/dL (ref 0.44–1.00)
GFR calc Af Amer: >60 mL/min (ref >60)
GFR calc non Af Amer: >60 mL/min (ref >60)
GLUCOSE: 98 mg/dL (ref 70–99)
POTASSIUM: 4.4 mmol/L (ref 3.5–5.1)
Sodium: 141 mmol/L (ref 135–145)
TOTAL PROTEIN: 7.2 g/dL (ref 6.5–8.1)
Total Bilirubin: 0.7 mg/dL (ref 0.3–1.2)

## 2017-11-16 LAB — CBC WITH DIFFERENTIAL/PLATELET
BASOS ABS: 0.1 10*3/uL (ref 0.0–0.1)
Basophils Relative: 1 %
EOS PCT: 3 %
Eosinophils Absolute: 0.2 10*3/uL (ref 0.0–0.7)
HEMATOCRIT: 44.3 % (ref 36.0–46.0)
Hemoglobin: 14.5 g/dL (ref 12.0–15.0)
LYMPHS ABS: 2.6 10*3/uL (ref 0.7–4.0)
LYMPHS PCT: 36 %
MCH: 28 pg (ref 26.0–34.0)
MCHC: 32.7 g/dL (ref 30.0–36.0)
MCV: 85.5 fL (ref 78.0–100.0)
MONO ABS: 0.4 10*3/uL (ref 0.1–1.0)
Monocytes Relative: 5 %
NEUTROS ABS: 3.9 10*3/uL (ref 1.7–7.7)
Neutrophils Relative %: 55 %
Platelets: 327 10*3/uL (ref 150–400)
RBC: 5.18 MIL/uL — ABNORMAL HIGH (ref 3.87–5.11)
RDW: 15.7 % — AB (ref 11.5–15.5)
WBC: 7.1 10*3/uL (ref 4.0–10.5)

## 2017-11-16 LAB — TSH: TSH: 2.285 u[IU]/mL (ref 0.350–4.500)

## 2017-11-16 LAB — MAGNESIUM: MAGNESIUM: 1.8 mg/dL (ref 1.7–2.4)

## 2017-11-16 LAB — LIPASE, BLOOD: Lipase: 79 U/L — ABNORMAL HIGH (ref 11–51)

## 2017-11-16 MED ORDER — SODIUM CHLORIDE 0.9 % IV BOLUS
1000.0000 mL | Freq: Once | INTRAVENOUS | Status: AC
  Administered 2017-11-16: 1000 mL via INTRAVENOUS

## 2017-11-16 MED ORDER — ONDANSETRON HCL 4 MG/2ML IJ SOLN
4.0000 mg | Freq: Once | INTRAMUSCULAR | Status: AC
  Administered 2017-11-16: 4 mg via INTRAVENOUS
  Filled 2017-11-16: qty 2

## 2017-11-16 MED ORDER — ONDANSETRON HCL 4 MG PO TABS: 4.0000 mg | ORAL_TABLET | Freq: Four times a day (QID) | ORAL | 0 refills | Status: DC

## 2017-11-16 NOTE — ED Provider Notes (Signed)
[facility] EMERGENCY DEPARTMENT Provider Note   CSN: [id number] Arrival date & time: 11/16/17  1053     History   Chief Complaint Chief Complaint  Patient presents with  . Weakness    HPI Charlotte Woods is a 72 y.o. female.  Weakness, tremors, shaking, feeling cold, nausea and vomiting intermittently for several days.  She saw her primary care doctor and was referred to the emergency department.  She has a history of thyroid disease, hypokalemia, hypomagnesemia, palpitations.  She feels dehydrated and like she might pass out.  Severity symptoms mild to moderate.  Nothing makes symptoms better or worse.  Her primary care relationship is [facility]     Past Medical History:  Diagnosis Date  . GERD (gastroesophageal reflux disease)   . Heart attack 2010   [facility] Dr. Scotty Court, silent  . HTN (hypertension)   . Interstitial cystitis   . PONV (postoperative nausea and vomiting)     Patient Active Problem List   Diagnosis Date Noted  . Diverticulosis of colon without hemorrhage   . Hemorrhoid   . Rectal bleeding 04/29/2015    Past Surgical History:  Procedure Laterality Date  . APPENDECTOMY    . carpel tunnel    . CHOLECYSTECTOMY    . COLONOSCOPY  10/2013   Dr. Aleene Davidson: sigmoid diverticulosis  . COLONOSCOPY N/A 05/19/2015   Procedure: COLONOSCOPY;  Surgeon: Corbin Ade, MD;  Location: [facility];  Service: Endoscopy;  Laterality: N/A;  0915 - moved to 9:30 - office to notify  . ESOPHAGOGASTRODUODENOSCOPY N/A 05/19/2015   Procedure: ESOPHAGOGASTRODUODENOSCOPY (EGD);  Surgeon: Corbin Ade, MD;  Location: [facility] SU[facility];  Service: Endoscopy;  Laterality: N/A;  . hysterectomy  2009   complete. endometrial cancer  . TONSILLECTOMY       OB History   None      Home Medications    Prior to Admission medications   Medication Sig Start Date End Date Taking? Authorizing Provider  aspirin 81 MG tablet Take 81 mg by mouth daily.   Yes [provider]  diclofenac (VOLTAREN) 75 MG EC tablet Take 75 mg by mouth 2 (two) times daily. 11/02/17  Yes [provider]  ELMIRON 100 MG capsule Reported on 05/12/2015 03/25/15  Yes [provider]  Glucosamine 500 MG CAPS Take 1,000 mg by mouth daily.   Yes [provider]  hydrOXYzine (ATARAX/VISTARIL) 25 MG tablet  02/03/15  Yes [provider]  levothyroxine (SYNTHROID, LEVOTHROID) 25 MCG tablet Take 1 tablet by mouth daily. 09/25/17  Yes [provider]  lisinopril-hydrochlorothiazide (PRINZIDE,ZESTORETIC) 20-25 MG tablet  04/15/15  Yes [provider]  MYRBETRIQ 25 MG TB24 tablet Take 25 mg by mouth daily. 11/10/17  Yes [provider]  Omega-3 Fatty Acids (FISH OIL) 1000 MG CPDR Take by mouth daily.   Yes [provider]  pantoprazole (PROTONIX) 40 MG tablet Reported on 05/12/2015 04/19/15  Yes [provider]  Probiotic Product (PROBIOTIC DAILY PO) Take by mouth.   Yes [provider]  Vitamin D, Ergocalciferol, (DRISDOL) 50000 units CAPS capsule Take 50,000 Units by mouth every 7 (seven) days.   Yes [provider]  levothyroxine (SYNTHROID, LEVOTHROID) 50 MCG tablet Take 1 tablet by mouth daily. 11/06/17   [provider]  ondansetron (ZOFRAN) 4 MG tablet Take 1 tablet (4 mg total) by mouth every 6 (six) hours. 11/16/17   Donnetta Hutching, MD  polyethylene glycol-electrolytes (NULYTELY/GOLYTELY) 420 g solution Take 4,000  mLs by mouth once. 05/03/15   Tiffany Kocher, PA-C  predniSONE (STERAPRED UNI-PAK 21 TAB) 10 MG (21) TBPK tablet prednisone 10 mg tablets in a dose pack    [provider]  sertraline (ZOLOFT) 50 MG tablet Take 1 tablet by mouth daily. 09/18/17   [provider]    Family History Family History  Problem Relation Age of Onset  . Endometrial cancer Mother        died old age, 90  . Lung cancer Father   . Colon cancer Neg Hx     Social History Social  History   Tobacco Use  . Smoking status: Never Smoker  . Smokeless tobacco: Never Used  Substance Use Topics  . Alcohol use: No    Alcohol/week: 0.0 oz  . Drug use: No     Allergies   Codeine; Penicillins; Phenazopyridine; Pravastatin; Septra [sulfamethoxazole-trimethoprim]; Simvastatin; and Zetia [ezetimibe]   Review of Systems Review of Systems  All other systems reviewed and are negative.    Physical Exam Updated Vital Signs BP (!) 124/105 (BP Location: Right Arm)   Pulse 66   Temp (!) 97.5 F (36.4 C) (Oral)   Resp 16   Ht 5\' 4"  (1.626 m)   Wt 92.5 kg (204 lb)   SpO2 99%   BMI 35.02 kg/m   Physical Exam  Constitutional: She is oriented to person, place, and time. She appears well-developed and well-nourished.  nad  HENT:  Head: Normocephalic and atraumatic.  Eyes: Conjunctivae are normal.  Neck: Neck supple.  Cardiovascular: Normal rate and regular rhythm.  Pulmonary/Chest: Effort normal and breath sounds normal.  Abdominal: Soft. Bowel sounds are normal.  Musculoskeletal: Normal range of motion.  Neurological: She is alert and oriented to person, place, and time.  Skin: Skin is warm and dry.  Psychiatric: She has a normal mood and affect. Her behavior is normal.  Nursing note and vitals reviewed.    ED Treatments / Results  Labs (all labs ordered are listed, but only abnormal results are displayed) Labs Reviewed  COMPREHENSIVE METABOLIC PANEL - Abnormal; Notable for the following components:      Result Value   BUN 27 (*)    Calcium 10.4 (*)    Alkaline Phosphatase 29 (*)    All other components within normal limits  CBC WITH DIFFERENTIAL/PLATELET - Abnormal; Notable for the following components:   RBC 5.18 (*)    RDW 15.7 (*)    All other components within normal limits  LIPASE, BLOOD - Abnormal; Notable for the following components:   Lipase 79 (*)    All other components within normal limits  URINALYSIS, ROUTINE W REFLEX MICROSCOPIC -  Abnormal; Notable for the following components:   Color, Urine STRAW (*)    All other components within normal limits  TSH  MAGNESIUM    EKG EKG Interpretation  Date/Time:  Friday November 16 2017 11:30:10 EDT Ventricular Rate:  51 PR Interval:    QRS Duration: 107 QT Interval:  447 QTC Calculation: 412 R Axis:   -83 Text Interpretation:  Sinus rhythm LAD, consider left anterior fascicular block Low voltage, precordial leads Probable anteroseptal infarct, old Confirmed by Donnetta Hutching ([id number]) on 11/16/2017 12:23:20 PM   Radiology No results found.  Procedures Procedures (including critical care time)  Medications Ordered in ED Medications  ondansetron (ZOFRAN) injection 4 mg (4 mg Intravenous Given 11/16/17 1319)  sodium chloride 0.9 % bolus 1,000 mL (0 mLs Intravenous Stopped 11/16/17 1405)  Initial Impression / Assessment and Plan / ED Course  I have reviewed the triage vital signs and the nursing notes.  Pertinent labs & imaging results that were available during my care of the patient were reviewed by me and considered in my medical decision making (see chart for details).     Presents with nausea, vomiting, tremors, shaking, feeling cold.  Physical exam was normal.  Magnesium, potassium, TSH, urinalysis all normal.  Lipase minimally elevated at 79.  No epigastric pain noted.  These findings were discussed with the patient and her husband.  Discharge medications Zofran 4 mg.  No acute abdomen at discharge.  Final Clinical Impressions(s) / ED Diagnoses   Final diagnoses:  Weakness  Elevated lipase    ED Discharge Orders        Ordered    ondansetron (ZOFRAN) 4 MG tablet  Every 6 hours,   Status:  Discontinued     11/16/17 1634    ondansetron (ZOFRAN) 4 MG tablet  Every 6 hours     11/16/17 1648       Donnetta Hutchingook, Natane Heward, MD 11/16/17 1713

## 2017-11-16 NOTE — ED Triage Notes (Signed)
Patient complaining of vomiting x 5 days last week. States that has resolved but is now complaining of weakness and palpitations x 1 week. States she saw PCP today and was sent here for possible dehydration.

## 2017-11-16 NOTE — Discharge Instructions (Addendum)
Your lipase which is a chemical associate with a pancreas gland was mildly elevated at 79.  This can be followed up by her primary care doctor.  Otherwise potassium and magnesium were good.  Urinalysis normal.  Prescription for nausea medication.  Increase fluids.

## 2017-11-16 NOTE — ED Notes (Signed)
Pt ambulated to BR

## 2018-04-29 DIAGNOSIS — M25559 Pain in unspecified hip: Secondary | ICD-10-CM | POA: Insufficient documentation

## 2018-04-29 DIAGNOSIS — M810 Age-related osteoporosis without current pathological fracture: Secondary | ICD-10-CM | POA: Insufficient documentation

## 2019-05-21 ENCOUNTER — Encounter: Payer: Self-pay | Admitting: Internal Medicine

## 2019-06-13 ENCOUNTER — Ambulatory Visit: Payer: Medicare HMO | Admitting: Gastroenterology

## 2019-06-23 ENCOUNTER — Encounter: Payer: Self-pay | Admitting: Gastroenterology

## 2019-06-23 NOTE — Progress Notes (Signed)
Referring Provider:  Altamease Oiler, FNP Primary Care Physician:  Altamease Oiler, FNP Primary Gastroenterologist:  Dr. Jena Gauss  Chief Complaint  Patient presents with  . Gastroesophageal Reflux    doing better now since nowing what foods not to eat with GERD    HPI:   Charlotte Woods is a 74 y.o. female presenting today at the request of  Altamease Oiler, FNP for GERD. Patient last seen in our office in January 2017 for rectal bleeding.  She reported 1 episode of passing moderate to large amount of fresh blood per rectum.  Also reported loss of appetite 2 weeks prior to rectal bleeding.  She was seen in the ED at the time of rectal bleeding with normal hemoglobin, heme positive on rectal exam.  She was taking Mobic, baby aspirin, Aleve, and Elmiron so she was started on Protonix empirically.  At the time of her office visit, she reported stopping Aleve and Elmiron.  No further rectal bleeding.  Appetite improved.  She was scheduled for colonoscopy +/-EGD for further evaluation.  TCS/EGD completed on 05/19/2015: TCS: Internal hemorrhoids, colonic diverticulosis.  Suspected patient experienced hemorrhoid or diverticular bleed.  Recommended repeat TCS in 10 years. EGD: Normal exam.  Reviewed PCP note dated 03/10/2019.  Patient reported fullness after eating, hoarseness and lump in her throat,.  Felt omeprazole was not effective for reflux symptoms.  Unintentional weight loss of 10 pounds in 6 weeks.  Omeprazole was changed to Protonix 40 mg daily.  Referral to GI placed.  Today:  Couldn't take protonix. Made her nauseated. Resumed omeprazole 20 mg daily. GERD symptoms are doing great since avoiding chocolate and tomato sauce. No breakthrough symptoms at this time. Admits to foods feeling slow going down. Worsening. Doesn't feel foods get stuck. Occurs with breads/solid foods. No trouble with soft foods or liquids.   Has had a lot of weight loss. Reports 20+ lb weight loss in the last 3  months. Can't eat many meats. Feels like she gets full quickly. Started noticing this around Christmas 2020. Diet consist of egg biscuit in the morning, not much lunch because she doesn't get up until 10 am, eats a minimal amount for dinner. Also has loss of appetite when it comes to meats. States they make her sick to look at them.   No nausea or vomiting. No abdominal pain.   BMs every other day. Stools are hard with straining. States she has always had trouble with her bowels. Doesn't take anything for constipation. Was taking stool softeners in the past. They helped. She just stopped taking them. Very rare diarrhea. No blood in the stool. No black stools.    No fever, chills, lightheadedness, dizziness, pre-syncope, or syncope. No chest pain, heart palpitations, shortness of breath, or cough.   Larey Seat last week tripping over cord and hitting her face on air conditioning unit. Broke her nose. She has follow-up with ENT tomorrow.    Past Medical History:  Diagnosis Date  . GERD (gastroesophageal reflux disease)   . Heart attack [facility]) 2010   [facility] Dr. Scotty Court, silent  . HTN (hypertension)   . Interstitial cystitis   . PONV (postoperative nausea and vomiting)     Past Surgical History:  Procedure Laterality Date  . APPENDECTOMY    . carpel tunnel    . CHOLECYSTECTOMY    . COLONOSCOPY  10/2013   Dr. Aleene Davidson: sigmoid diverticulosis  . COLONOSCOPY N/A 05/19/2015   Procedure: COLONOSCOPY;  Surgeon: Corbin Ade,  internal hemorrhoids, colonic diverticulosis.  Suspected patient experienced hemorrhoidal or diverticular bleed.  Recommended repeat colonoscopy in 10 years.  . ESOPHAGOGASTRODUODENOSCOPY N/A 05/19/2015   Procedure: ESOPHAGOGASTRODUODENOSCOPY (EGD);  Surgeon: Corbin Ade, MD; normal exam.  . hysterectomy  2009   complete. endometrial cancer  . TONSILLECTOMY      Current Outpatient Medications  Medication Sig Dispense Refill  . aspirin 81 MG tablet Take 81 mg by mouth  daily.    . Cyanocobalamin (B-12 PO) Take by mouth daily.    . diclofenac (VOLTAREN) 75 MG EC tablet Take 75 mg by mouth 2 (two) times daily.  0  . ELMIRON 100 MG capsule Take 300 mg by mouth daily. Reported on 05/12/2015    . GARLIC PO Take by mouth daily.    . Ginger, Zingiber officinalis, (GINGER ROOT) 550 MG CAPS Take 2 capsules by mouth daily.    . Glucosamine 500 MG CAPS Take 1,000 mg by mouth in the morning and at bedtime.     . hydrOXYzine (ATARAX/VISTARIL) 25 MG tablet Take 25 mg by mouth daily.     Marland Kitchen levothyroxine (SYNTHROID, LEVOTHROID) 50 MCG tablet Take 1 tablet by mouth daily.  0  . lisinopril-hydrochlorothiazide (PRINZIDE,ZESTORETIC) 20-25 MG tablet Take 2 tablets by mouth daily.     . Multiple Vitamin (MULTIVITAMIN) tablet Take 1 tablet by mouth every other day.    Marland Kitchen MYRBETRIQ 25 MG TB24 tablet Take 25 mg by mouth daily.  2  . Omega-3 Fatty Acids (FISH OIL) 1200 MG CAPS Take 2 capsules by mouth daily.     Marland Kitchen omeprazole (PRILOSEC) 20 MG capsule Take 20 mg by mouth daily.    . sertraline (ZOLOFT) 50 MG tablet Take 2 tablets by mouth daily.     Marland Kitchen tiZANidine (ZANAFLEX) 4 MG tablet Take 4 mg by mouth every 6 (six) hours as needed for muscle spasms.     No current facility-administered medications for this visit.    Allergies as of 06/25/2019 - Review Complete 06/25/2019  Allergen Reaction Noted  . Codeine Other (See Comments) 04/29/2015  . Penicillins Hives 04/29/2015  . Phenazopyridine Hives 04/29/2015  . Pravastatin Other (See Comments) 04/29/2015  . Septra [sulfamethoxazole-trimethoprim] Hives 04/29/2015  . Simvastatin Other (See Comments) 04/29/2015  . Zetia [ezetimibe] Other (See Comments) 04/29/2015    Family History  Problem Relation Age of Onset  . Endometrial cancer Mother        died old age, 40  . Lung cancer Father   . Colon cancer Neg Hx     Social History   Socioeconomic History  . Marital status: Divorced    Spouse name: Not on file  . Number of  children: 2  . Years of education: Not on file  . Highest education level: Not on file  Occupational History  . Not on file  Tobacco Use  . Smoking status: Never Smoker  . Smokeless tobacco: Never Used  Substance and Sexual Activity  . Alcohol use: No    Alcohol/week: 0.0 standard drinks  . Drug use: No  . Sexual activity: Not on file  Other Topics Concern  . Not on file  Social History Narrative  . Not on file   Social Determinants of Health   Financial Resource Strain:   . Difficulty of Paying Living Expenses: Not on file  Food Insecurity:   . Worried About Programme researcher, broadcasting/film/video in the Last Year: Not on file  . Ran Out of Food in the Last Year:  Not on file  Transportation Needs:   . Lack of Transportation (Medical): Not on file  . Lack of Transportation (Non-Medical): Not on file  Physical Activity:   . Days of Exercise per Week: Not on file  . Minutes of Exercise per Session: Not on file  Stress:   . Feeling of Stress : Not on file  Social Connections:   . Frequency of Communication with Friends and Family: Not on file  . Frequency of Social Gatherings with Friends and Family: Not on file  . Attends Religious Services: Not on file  . Active Member of Clubs or Organizations: Not on file  . Attends Archivist Meetings: Not on file  . Marital Status: Not on file  Intimate Partner Violence:   . Fear of Current or Ex-Partner: Not on file  . Emotionally Abused: Not on file  . Physically Abused: Not on file  . Sexually Abused: Not on file    Review of Systems: Gen: See HPI CV: See HPI Resp: HPI ough.  GI: See HPI. GU : Denies urinary burning, urinary frequency, urinary hesitancy MS: Admits to chronic hip pain.  Derm: Denies rash Psych: Admits to anxiety. Medications work well.  Heme: Admits to easy bruising. No bleeding other than with recent fall.  Physical Exam: BP 127/77   Pulse 61   Temp (!) 97 F (36.1 C) (Oral)   Ht 5' 4.5" (1.638 m)   Wt 180  lb 6.4 oz (81.8 kg)   BMI 30.49 kg/m  General:   Alert and oriented. Pleasant and cooperative. Well-nourished and well-developed.  Face is bruised from recent fall.. Eyes:  Without icterus, sclera clear and conjunctiva pink.  Ears:  Normal auditory acuity. Lungs:  Clear to auscultation bilaterally. No wheezes, rales, or rhonchi. No distress.  Heart:  S1, S2 present without murmurs appreciated.  Abdomen:  +BS, soft, non-tender and non-distended. No HSM noted. No guarding or rebound. No masses appreciated.  Rectal:  Deferred  Msk:  Symmetrical without gross deformities. Normal posture. Extremities:  Without edema. Neurologic:  Alert and  oriented x4;  grossly normal neurologically.Marland Kitchen Psych: Normal mood and affect.

## 2019-06-25 ENCOUNTER — Other Ambulatory Visit: Payer: Self-pay

## 2019-06-25 ENCOUNTER — Ambulatory Visit (INDEPENDENT_AMBULATORY_CARE_PROVIDER_SITE_OTHER): Payer: Medicare Other | Admitting: Gastroenterology

## 2019-06-25 ENCOUNTER — Encounter: Payer: Self-pay | Admitting: Gastroenterology

## 2019-06-25 DIAGNOSIS — R634 Abnormal weight loss: Secondary | ICD-10-CM

## 2019-06-25 DIAGNOSIS — K219 Gastro-esophageal reflux disease without esophagitis: Secondary | ICD-10-CM | POA: Diagnosis not present

## 2019-06-25 DIAGNOSIS — R6881 Early satiety: Secondary | ICD-10-CM

## 2019-06-25 DIAGNOSIS — K5900 Constipation, unspecified: Secondary | ICD-10-CM | POA: Insufficient documentation

## 2019-06-25 DIAGNOSIS — R1310 Dysphagia, unspecified: Secondary | ICD-10-CM

## 2019-06-25 NOTE — Assessment & Plan Note (Addendum)
Patient reports symptoms of feeling foods going down her esophagus slowly but has been worsening.  Denies foods sticking in her esophagus.  Symptoms typically occur with breads or other solid foods.  No trouble with soft foods or liquids.  Of note, she was having uncontrolled GERD; however, symptoms are now well controlled as she is on omeprazole 20 mg daily and avoiding dietary triggers.  Patient may have esophageal web, ring, or stricture contributing to dysphagia symptoms.  She may also have underlying esophageal dysmotility. We are pursuing EGD for early satiety and weight loss and will add on possible dilation.   Proceed with EGD +/-dilation with propofol with Dr. Jena Gauss in the near future. The risks, benefits, and alternatives have been discussed in detail with patient. They have stated understanding and desire to proceed.  Propofol due to medications including hydroxyzine, Zoloft, and Zanaflex. Continue omeprazole 20 mg daily 30 minutes before breakfast. Follow-up after procedure.

## 2019-06-25 NOTE — Assessment & Plan Note (Signed)
Chronic history of constipation.  BMs every other day but stools are hard with straining.  Not currently taking any medications to help with this.  Denies bright red blood per rectum or melena.  She does report 20+ pound weight loss in the last 3 months, but I suspect this is likely secondary to her early satiety as discussed below.  Colonoscopy in 2017 with internal hemorrhoids and colonic diverticulosis with recommendations to repeat in 10 years.  Start MiraLAX 1 capful (17 g) in 8 ounces of water daily.  Advised to decrease frequency if loose stools develop. Follow-up after EGD for early satiety, weight loss, and ?dysphagia.

## 2019-06-25 NOTE — Assessment & Plan Note (Signed)
Addressed under early satiety. 

## 2019-06-25 NOTE — Assessment & Plan Note (Signed)
Much improved after avoiding dietary triggers including chocolate and tomato sauce.  Currently on omeprazole 20 mg daily with no breakthrough symptoms.  She does admit to sensation of foods going down her esophagus slowly, early satiety, and significant unintentional weight loss as discussed below.   As her GERD symptoms are well controlled, she will continue omeprazole 20 mg daily.  Advised she avoid fried, fatty, greasy, spicy, citrus foods.  Avoid caffeine, carbonated beverages, and chocolate.  Do not eat within 3 hours of laying down. Due to early satiety, weight loss, and ? Dysphagia, we will proceed with EGD +/-dilation with propofol with Dr. Jena Gauss in the near future as discussed below. Follow-up after EGD.  Advised to call should questions or concerns prior.

## 2019-06-25 NOTE — Assessment & Plan Note (Addendum)
74 year old female reporting early satiety x3 months with associated 20+ pound weight loss.  Also with loss of appetite with meats.  States it makes her sick to look at them.  Also with dysphagia-like symptoms reporting foods going down her esophagus slowly.  She was having uncontrolled GERD a few months ago; however, she is now on omeprazole 20 mg daily, avoiding dietary triggers, and GERD symptoms are well controlled.  Denies nausea, vomiting, or abdominal pain.  No bright red blood per rectum or melena.  EGD and TCS completed in 2017.  TCS with internal hemorrhoids and diverticulosis with recommended repeat in 10 years.  EGD normal.  Her abdominal exam today is benign.  Patient needs further evaluation of early satiety and weight loss with EGD.  Differentials include gastroparesis, pyloric stenosis, or possible malignancy.  We will also add on possible dilation due to dysphagia-like symptoms.  Proceed with EGD +/- dilation with propofol with Dr. Jena Gauss in the near future. The risks, benefits, and alternatives have been discussed in detail with patient. They have stated understanding and desire to proceed.  Propofol due to medications including hydroxyzine, Zoloft, and Zanaflex. Update basic labs including CBC, CMP, and TSH as she has not had any recently. Continue omeprazole 20 mg daily 30 minutes before breakfast. Discussed adding protein shakes, boost, or Ensure daily to help get adequate nutrition and maintain weight. Plan to follow-up after EGD.

## 2019-06-25 NOTE — Patient Instructions (Addendum)
We will get you scheduled for an upper endoscopy with possible dilation of your esophagus in the near future with Dr. Jena Gauss.  I would like to update your basic labs as you have not had any recently.   Continue taking omeprazole 20 mg daily 30 minutes before breakfast.  Continue avoiding reflux triggers.  Avoid fried, fatty, greasy, spicy, citrus foods.  Avoid caffeine, carbonated beverages, and chocolate.  Do not eat within 3 hours of laying down.  Try adding protein shakes, Ensure, or boost daily to ensure you are getting adequate nutrition and to help with weight loss.  For constipation, start MiraLAX 1 capful (17 g) in 8 ounces of water daily.  If you develop frequent loose stools, you can decrease the frequency of MiraLAX.  We will plan to follow-up with you after your upper endoscopy.  Call if you have questions or concerns prior.  Ermalinda Memos, PA-C [facility]   Food Choices for Gastroesophageal Reflux Disease, Adult When you have gastroesophageal reflux disease (GERD), the foods you eat and your eating habits are very important. Choosing the right foods can help ease the discomfort of GERD. Consider working with a diet and nutrition specialist (dietitian) to help you make healthy food choices. What general guidelines should I follow?  Eating plan  Choose healthy foods low in fat, such as fruits, vegetables, whole grains, low-fat dairy products, and lean meat, fish, and poultry.  Eat frequent, small meals instead of three large meals each day. Eat your meals slowly, in a relaxed setting. Avoid bending over or lying down until 2-3 hours after eating.  Limit high-fat foods such as fatty meats or fried foods.  Limit your intake of oils, butter, and shortening to less than 8 teaspoons each day.  Avoid the following: ? Foods that cause symptoms. These may be different for different people. Keep a food diary to keep track of foods that cause  symptoms. ? Alcohol. ? Drinking large amounts of liquid with meals. ? Eating meals during the 2-3 hours before bed.  Cook foods using methods other than frying. This may include baking, grilling, or broiling. Lifestyle  Maintain a healthy weight. Ask your health care provider what weight is healthy for you. If you need to lose weight, work with your health care provider to do so safely.  Exercise for at least 30 minutes on 5 or more days each week, or as told by your health care provider.  Avoid wearing clothes that fit tightly around your waist and chest.  Do not use any products that contain nicotine or tobacco, such as cigarettes and e-cigarettes. If you need help quitting, ask your health care provider.  Sleep with the head of your bed raised. Use a wedge under the mattress or blocks under the bed frame to raise the head of the bed. What foods are not recommended? The items listed may not be a complete list. Talk with your dietitian about what dietary choices are best for you. Grains Pastries or quick breads with added fat. [place] toast. Vegetables Deep fried vegetables. [place] fries. Any vegetables prepared with added fat. Any vegetables that cause symptoms. For some people this may include tomatoes and tomato products, chili peppers, onions and garlic, and horseradish. Fruits Any fruits prepared with added fat. Any fruits that cause symptoms. For some people this may include citrus fruits, such as oranges, grapefruit, pineapple, and lemons. Meats and other protein foods High-fat meats, such as fatty beef or pork, hot dogs, ribs, ham, sausage,  salami and bacon. Fried meat or protein, including fried fish and fried chicken. Nuts and nut butters. Dairy Whole milk and chocolate milk. Sour cream. Cream. Ice cream. Cream cheese. Milk shakes. Beverages Coffee and tea, with or without caffeine. Carbonated beverages. Sodas. Energy drinks. Fruit juice made with acidic fruits (such as orange  or grapefruit). Tomato juice. Alcoholic drinks. Fats and oils Butter. Margarine. Shortening. Ghee. Sweets and desserts Chocolate and cocoa. Donuts. Seasoning and other foods Pepper. Peppermint and spearmint. Any condiments, herbs, or seasonings that cause symptoms. For some people, this may include curry, hot sauce, or vinegar-based salad dressings. Summary  When you have gastroesophageal reflux disease (GERD), food and lifestyle choices are very important to help ease the discomfort of GERD.  Eat frequent, small meals instead of three large meals each day. Eat your meals slowly, in a relaxed setting. Avoid bending over or lying down until 2-3 hours after eating.  Limit high-fat foods such as fatty meat or fried foods. This information is not intended to replace advice given to you by your health care provider. Make sure you discuss any questions you have with your health care provider. Document Revised: 08/01/2018 Document Reviewed: 04/11/2016 Elsevier Patient Education  [place].  Constipation, Adult Constipation is when a person:  Poops (has a bowel movement) fewer times in a week than normal.  Has a hard time pooping.  Has poop that is dry, hard, or bigger than normal. Follow these instructions at home: Eating and drinking   Eat foods that have a lot of fiber, such as: ? Fresh fruits and vegetables. ? Whole grains. ? Beans.  Eat less of foods that are high in fat, low in fiber, or overly processed, such as: ? [place] fries. ? Hamburgers. ? Cookies. ? Candy. ? Soda.  Drink enough fluid to keep your pee (urine) clear or pale yellow. General instructions  Exercise regularly or as told by your doctor.  Go to the restroom when you feel like you need to poop. Do not hold it in.  Take over-the-counter and prescription medicines only as told by your doctor. These include any fiber supplements.  Do pelvic floor retraining exercises, such as: ? Doing deep  breathing while relaxing your lower belly (abdomen). ? Relaxing your pelvic floor while pooping.  Watch your condition for any changes.  Keep all follow-up visits as told by your doctor. This is important. Contact a doctor if:  You have pain that gets worse.  You have a fever.  You have not pooped for 4 days.  You throw up (vomit).  You are not hungry.  You lose weight.  You are bleeding from the anus.  You have thin, pencil-like poop (stool). Get help right away if:  You have a fever, and your symptoms suddenly get worse.  You leak poop or have blood in your poop.  Your belly feels hard or bigger than normal (is bloated).  You have very bad belly pain.  You feel dizzy or you faint. This information is not intended to replace advice given to you by your health care provider. Make sure you discuss any questions you have with your health care provider. Document Revised: 03/23/2017 Document Reviewed: 09/29/2015 Elsevier Patient Education  2020 [employer].

## 2019-08-19 NOTE — Patient Instructions (Signed)
Charlotte Woods  08/19/2019     @PREFPERIOPPHARMACY @   Your procedure is scheduled on  08/25/2019  Report to 10/25/2019 at  [place]  PM.  Call this number if you have problems the morning of surgery:  [phone]   Remember:  Follow the diet instructions given to you by Dr [phone] office.                     Take these medicines the morning of surgery with A SIP OF WATER  Baclofen, voltaren(if needed), elmiron, hydroxyzine, levothyroxine, myrbetriq, omeprazole, zoloft.    Do not wear jewelry, make-up or nail polish.  Do not wear lotions, powders, or perfumes. Please wear deodorant and brush your teeth.  Do not shave 48 hours prior to surgery.  Men may shave face and neck.  Do not bring valuables to the hospital.  [facility] is not responsible for any belongings or valuables.  Contacts, dentures or bridgework may not be worn into surgery.  Leave your suitcase in the car.  After surgery it may be brought to your room.  For patients admitted to the hospital, discharge time will be determined by your treatment team.  Patients discharged the day of surgery will not be allowed to drive home.   Name and phone number of your driver:   family Special instructions:  DO NOT smoke the day of your procedure.  Please read over the following fact sheets that you were given. Anesthesia Post-op Instructions and Care and Recovery After Surgery       Upper Endoscopy, Adult, Care After This sheet gives you information about how to care for yourself after your procedure. Your health care provider may also give you more specific instructions. If you have problems or questions, contact your health care provider. What can I expect after the procedure? After the procedure, it is common to have:  A sore throat.  Mild stomach pain or discomfort.  Bloating.  Nausea. Follow these instructions at home:   Follow instructions from your health care provider about what to eat or drink  after your procedure.  Return to your normal activities as told by your health care provider. Ask your health care provider what activities are safe for you.  Take over-the-counter and prescription medicines only as told by your health care provider.  Do not drive for 24 hours if you were given a sedative during your procedure.  Keep all follow-up visits as told by your health care provider. This is important. Contact a health care provider if you have:  A sore throat that lasts longer than one day.  Trouble swallowing. Get help right away if:  You vomit blood or your vomit looks like coffee grounds.  You have: ? A fever. ? Bloody, black, or tarry stools. ? A severe sore throat or you cannot swallow. ? Difficulty breathing. ? Severe pain in your chest or abdomen. Summary  After the procedure, it is common to have a sore throat, mild stomach discomfort, bloating, and nausea.  Do not drive for 24 hours if you were given a sedative during the procedure.  Follow instructions from your health care provider about what to eat or drink after your procedure.  Return to your normal activities as told by your health care provider. This information is not intended to replace advice given to you by your health care provider. Make sure you discuss any questions you have with your  health care provider. Document Revised: 10/02/2017 Document Reviewed: 09/10/2017 Elsevier Patient Education  [place].  Esophageal Dilatation Esophageal dilatation, also called esophageal dilation, is a procedure to widen or open (dilate) a blocked or narrowed part of the esophagus. The esophagus is the part of the body that moves food and liquid from the mouth to the stomach. You may need this procedure if:  You have a buildup of scar tissue in your esophagus that makes it difficult, painful, or impossible to swallow. This can be caused by gastroesophageal reflux disease (GERD).  You have cancer of the  esophagus.  There is a problem with how food moves through your esophagus. In some cases, you may need this procedure repeated at a later time to dilate the esophagus gradually. Tell a health care provider about:  Any allergies you have.  All medicines you are taking, including vitamins, herbs, eye drops, creams, and over-the-counter medicines.  Any problems you or family members have had with anesthetic medicines.  Any blood disorders you have.  Any surgeries you have had.  Any medical conditions you have.  Any antibiotic medicines you are required to take before dental procedures.  Whether you are pregnant or may be pregnant. What are the risks? Generally, this is a safe procedure. However, problems may occur, including:  Bleeding due to a tear in the lining of the esophagus.  A hole (perforation) in the esophagus. What happens before the procedure?  Follow instructions from your health care provider about eating or drinking restrictions.  Ask your health care provider about changing or stopping your regular medicines. This is especially important if you are taking diabetes medicines or blood thinners.  Plan to have someone take you home from the hospital or clinic.  Plan to have a responsible adult care for you for at least 24 hours after you leave the hospital or clinic. This is important. What happens during the procedure?  You may be given a medicine to help you relax (sedative).  A numbing medicine may be sprayed into the back of your throat, or you may gargle the medicine.  Your health care provider may perform the dilatation using various surgical instruments, such as: ? Simple dilators. This instrument is carefully placed in the esophagus to stretch it. ? Guided wire bougies. This involves using an endoscope to insert a wire into the esophagus. A dilator is passed over this wire to enlarge the esophagus. Then the wire is removed. ? Balloon dilators. An endoscope  with a small balloon at the end is inserted into the esophagus. The balloon is inflated to stretch the esophagus and open it up. The procedure may vary among health care providers and hospitals. What happens after the procedure?  Your blood pressure, heart rate, breathing rate, and blood oxygen level will be monitored until the medicines you were given have worn off.  Your throat may feel slightly sore and numb. This will improve slowly over time.  You will not be allowed to eat or drink until your throat is no longer numb.  When you are able to drink, urinate, and sit on the edge of the bed without nausea or dizziness, you may be able to return home. Follow these instructions at home:  Take over-the-counter and prescription medicines only as told by your health care provider.  Do not drive for 24 hours if you were given a sedative during your procedure.  You should have a responsible adult with you for 24 hours after the  procedure.  Follow instructions from your health care provider about any eating or drinking restrictions.  Do not use any products that contain nicotine or tobacco, such as cigarettes and e-cigarettes. If you need help quitting, ask your health care provider.  Keep all follow-up visits as told by your health care provider. This is important. Get help right away if you:  Have a fever.  Have chest pain.  Have pain that is not relieved by medication.  Have trouble breathing.  Have trouble swallowing.  Vomit blood. Summary  Esophageal dilatation, also called esophageal dilation, is a procedure to widen or open (dilate) a blocked or narrowed part of the esophagus.  Plan to have someone take you home from the hospital or clinic.  For this procedure, a numbing medicine may be sprayed into the back of your throat, or you may gargle the medicine.  Do not drive for 24 hours if you were given a sedative during your procedure. This information is not intended to  replace advice given to you by your health care provider. Make sure you discuss any questions you have with your health care provider. Document Revised: 02/05/2019 Document Reviewed: 02/13/2017 Elsevier Patient Education  2020 [place] After These instructions provide you with information about caring for yourself after your procedure. Your health care provider may also give you more specific instructions. Your treatment has been planned according to current medical practices, but problems sometimes occur. Call your health care provider if you have any problems or questions after your procedure. What can I expect after the procedure? After your procedure, you may:  Feel sleepy for several hours.  Feel clumsy and have poor balance for several hours.  Feel forgetful about what happened after the procedure.  Have poor judgment for several hours.  Feel nauseous or vomit.  Have a sore throat if you had a breathing tube during the procedure. Follow these instructions at home: For at least 24 hours after the procedure:      Have a responsible adult stay with you. It is important to have someone help care for you until you are awake and alert.  Rest as needed.  Do not: ? Participate in activities in which you could fall or become injured. ? Drive. ? Use heavy machinery. ? Drink alcohol. ? Take sleeping pills or medicines that cause drowsiness. ? Make important decisions or sign legal documents. ? Take care of children on your own. Eating and drinking  Follow the diet that is recommended by your health care provider.  If you vomit, drink water, juice, or soup when you can drink without vomiting.  Make sure you have little or no nausea before eating solid foods. General instructions  Take over-the-counter and prescription medicines only as told by your health care provider.  If you have sleep apnea, surgery and certain medicines can increase  your risk for breathing problems. Follow instructions from your health care provider about wearing your sleep device: ? Anytime you are sleeping, including during daytime naps. ? While taking prescription pain medicines, sleeping medicines, or medicines that make you drowsy.  If you smoke, do not smoke without supervision.  Keep all follow-up visits as told by your health care provider. This is important. Contact a health care provider if:  You keep feeling nauseous or you keep vomiting.  You feel light-headed.  You develop a rash.  You have a fever. Get help right away if:  You have trouble breathing. Summary  For several hours after your procedure, you may feel sleepy and have poor judgment.  Have a responsible adult stay with you for at least 24 hours or until you are awake and alert. This information is not intended to replace advice given to you by your health care provider. Make sure you discuss any questions you have with your health care provider. Document Revised: 07/09/2017 Document Reviewed: 08/01/2015 Elsevier Patient Education  [place].

## 2019-08-21 ENCOUNTER — Other Ambulatory Visit (HOSPITAL_COMMUNITY)
Admission: RE | Admit: 2019-08-21 | Discharge: 2019-08-21 | Disposition: A | Discharge status: Home / Self Care | Payer: Medicare Other | Source: Ambulatory Visit | Attending: Internal Medicine | Admitting: Internal Medicine

## 2019-08-21 ENCOUNTER — Other Ambulatory Visit: Payer: Self-pay

## 2019-08-21 ENCOUNTER — Telehealth: Payer: Self-pay | Admitting: Emergency Medicine

## 2019-08-21 ENCOUNTER — Encounter (HOSPITAL_COMMUNITY)
Admission: RE | Admit: 2019-08-21 | Discharge: 2019-08-21 | Disposition: A | Discharge status: Home / Self Care | Payer: Medicare Other | Source: Ambulatory Visit | Attending: Internal Medicine | Admitting: Internal Medicine

## 2019-08-21 ENCOUNTER — Encounter (HOSPITAL_COMMUNITY): Payer: Self-pay

## 2019-08-21 DIAGNOSIS — I10 Essential (primary) hypertension: Secondary | ICD-10-CM | POA: Diagnosis not present

## 2019-08-21 DIAGNOSIS — Z20822 Contact with and (suspected) exposure to covid-19: Secondary | ICD-10-CM | POA: Insufficient documentation

## 2019-08-21 DIAGNOSIS — R9431 Abnormal electrocardiogram [ECG] [EKG]: Secondary | ICD-10-CM | POA: Diagnosis not present

## 2019-08-21 DIAGNOSIS — Z01818 Encounter for other preprocedural examination: Secondary | ICD-10-CM | POA: Insufficient documentation

## 2019-08-21 DIAGNOSIS — I444 Left anterior fascicular block: Secondary | ICD-10-CM | POA: Diagnosis not present

## 2019-08-21 HISTORY — DX: Unspecified osteoarthritis, unspecified site: M19.90

## 2019-08-21 LAB — COMPREHENSIVE METABOLIC PANEL
ALT: 26 U/L (ref 0–44)
AST: 26 U/L (ref 15–41)
Albumin: 3.4 g/dL — ABNORMAL LOW (ref 3.5–5.0)
Alkaline Phosphatase: 24 U/L — ABNORMAL LOW (ref 38–126)
Anion gap: 10 (ref 5–15)
BUN: 19 mg/dL (ref 8–23)
CO2: 24 mmol/L (ref 22–32)
Calcium: 9.5 mg/dL (ref 8.9–10.3)
Chloride: 104 mmol/L (ref 98–111)
Creatinine, Ser: 0.78 mg/dL (ref 0.44–1.00)
GFR calc Af Amer: >60 mL/min (ref >60)
GFR calc non Af Amer: >60 mL/min (ref >60)
Glucose, Bld: 131 mg/dL — ABNORMAL HIGH (ref 70–99)
Potassium: 3.5 mmol/L (ref 3.5–5.1)
Sodium: 138 mmol/L (ref 135–145)
Total Bilirubin: 0.7 mg/dL (ref 0.3–1.2)
Total Protein: 6.2 g/dL — ABNORMAL LOW (ref 6.5–8.1)

## 2019-08-21 LAB — CBC
HCT: 38.9 % (ref 36.0–46.0)
Hemoglobin: 12.1 g/dL (ref 12.0–15.0)
MCH: 27.7 pg (ref 26.0–34.0)
MCHC: 31.1 g/dL (ref 30.0–36.0)
MCV: 89 fL (ref 80.0–100.0)
Platelets: 221 10*3/uL (ref 150–400)
RBC: 4.37 MIL/uL (ref 3.87–5.11)
RDW: 16 % — ABNORMAL HIGH (ref 11.5–15.5)
WBC: 6 10*3/uL (ref 4.0–10.5)
nRBC: 0 % (ref 0.0–0.2)

## 2019-08-21 LAB — TSH: TSH: 1.795 u[IU]/mL (ref 0.350–4.500)

## 2019-08-21 NOTE — Telephone Encounter (Signed)
Pt called stated someone from our office called her.  Notified her that there is not documentation from anyone here reaching out to here but there is a procedure scheduled for 5/3 with doctor rourk pt stated she understood

## 2019-08-22 ENCOUNTER — Telehealth: Payer: Self-pay | Admitting: Emergency Medicine

## 2019-08-22 LAB — SARS CORONAVIRUS 2 (TAT 6-24 HRS): SARS Coronavirus 2: NEGATIVE

## 2019-08-22 NOTE — Telephone Encounter (Signed)
Pt called verified name and dob Stated someone called her form here or the hospital about her ekg after f/u in the notes and with the other nurses. notified pt it was not anyone from our office to please f/u with the hospital for further recommendations, pt stated she understood and that she would

## 2019-08-25 ENCOUNTER — Ambulatory Visit (HOSPITAL_COMMUNITY)
Admission: RE | Admit: 2019-08-25 | Discharge: 2019-08-25 | Disposition: A | Discharge status: Home / Self Care | Payer: Medicare Other | Attending: Internal Medicine | Admitting: Internal Medicine

## 2019-08-25 ENCOUNTER — Encounter (HOSPITAL_COMMUNITY): Payer: Self-pay | Admitting: Internal Medicine

## 2019-08-25 ENCOUNTER — Ambulatory Visit (HOSPITAL_COMMUNITY): Payer: Medicare Other | Admitting: Anesthesiology

## 2019-08-25 ENCOUNTER — Encounter (HOSPITAL_COMMUNITY)
Admission: RE | Disposition: A | Discharge status: Home / Self Care | Payer: Self-pay | Source: Home / Self Care | Attending: Internal Medicine

## 2019-08-25 DIAGNOSIS — Z885 Allergy status to narcotic agent status: Secondary | ICD-10-CM | POA: Diagnosis not present

## 2019-08-25 DIAGNOSIS — Z888 Allergy status to other drugs, medicaments and biological substances status: Secondary | ICD-10-CM | POA: Insufficient documentation

## 2019-08-25 DIAGNOSIS — K219 Gastro-esophageal reflux disease without esophagitis: Secondary | ICD-10-CM | POA: Insufficient documentation

## 2019-08-25 DIAGNOSIS — Z880 Allergy status to penicillin: Secondary | ICD-10-CM | POA: Insufficient documentation

## 2019-08-25 DIAGNOSIS — I10 Essential (primary) hypertension: Secondary | ICD-10-CM | POA: Diagnosis not present

## 2019-08-25 DIAGNOSIS — Z881 Allergy status to other antibiotic agents status: Secondary | ICD-10-CM | POA: Diagnosis not present

## 2019-08-25 DIAGNOSIS — R1314 Dysphagia, pharyngoesophageal phase: Secondary | ICD-10-CM | POA: Diagnosis present

## 2019-08-25 DIAGNOSIS — F419 Anxiety disorder, unspecified: Secondary | ICD-10-CM | POA: Insufficient documentation

## 2019-08-25 DIAGNOSIS — M1990 Unspecified osteoarthritis, unspecified site: Secondary | ICD-10-CM | POA: Diagnosis not present

## 2019-08-25 DIAGNOSIS — Z79899 Other long term (current) drug therapy: Secondary | ICD-10-CM | POA: Diagnosis not present

## 2019-08-25 DIAGNOSIS — Z791 Long term (current) use of non-steroidal anti-inflammatories (NSAID): Secondary | ICD-10-CM | POA: Diagnosis not present

## 2019-08-25 DIAGNOSIS — Z7982 Long term (current) use of aspirin: Secondary | ICD-10-CM | POA: Diagnosis not present

## 2019-08-25 DIAGNOSIS — Z79890 Hormone replacement therapy (postmenopausal): Secondary | ICD-10-CM | POA: Insufficient documentation

## 2019-08-25 DIAGNOSIS — R1310 Dysphagia, unspecified: Secondary | ICD-10-CM

## 2019-08-25 DIAGNOSIS — I252 Old myocardial infarction: Secondary | ICD-10-CM | POA: Diagnosis not present

## 2019-08-25 HISTORY — PX: MALONEY DILATION: SHX5535

## 2019-08-25 HISTORY — PX: ESOPHAGOGASTRODUODENOSCOPY (EGD) WITH PROPOFOL: SHX5813

## 2019-08-25 SURGERY — ESOPHAGOGASTRODUODENOSCOPY (EGD) WITH PROPOFOL
Anesthesia: General

## 2019-08-25 MED ORDER — LIDOCAINE VISCOUS HCL 2 % MT SOLN
15.0000 mL | Freq: Once | OROMUCOSAL | Status: AC
  Administered 2019-08-25: 15 mL via OROMUCOSAL

## 2019-08-25 MED ORDER — PROPOFOL 10 MG/ML IV BOLUS
INTRAVENOUS | Status: DC | PRN
  Administered 2019-08-25: 10 mg via INTRAVENOUS
  Administered 2019-08-25: 20 mg via INTRAVENOUS
  Administered 2019-08-25: 40 mg via INTRAVENOUS

## 2019-08-25 MED ORDER — CHLORHEXIDINE GLUCONATE CLOTH 2 % EX PADS: 6.0000 | MEDICATED_PAD | Freq: Once | CUTANEOUS | Status: DC

## 2019-08-25 MED ORDER — LACTATED RINGERS IV SOLN: INTRAVENOUS | Status: DC

## 2019-08-25 MED ORDER — PROPOFOL 500 MG/50ML IV EMUL
INTRAVENOUS | Status: DC | PRN
  Administered 2019-08-25: 150 ug/kg/min via INTRAVENOUS

## 2019-08-25 MED ORDER — GLYCOPYRROLATE 0.2 MG/ML IJ SOLN
INTRAMUSCULAR | Status: AC
  Filled 2019-08-25: qty 1

## 2019-08-25 MED ORDER — LIDOCAINE VISCOUS HCL 2 % MT SOLN
OROMUCOSAL | Status: AC
  Filled 2019-08-25: qty 15

## 2019-08-25 MED ORDER — GLYCOPYRROLATE 0.2 MG/ML IJ SOLN
0.2000 mg | Freq: Once | INTRAMUSCULAR | Status: AC
  Administered 2019-08-25: 0.2 mg via INTRAVENOUS

## 2019-08-25 MED ORDER — PROPOFOL 10 MG/ML IV BOLUS
INTRAVENOUS | Status: AC
  Filled 2019-08-25: qty 80

## 2019-08-25 NOTE — Anesthesia Preprocedure Evaluation (Signed)
Anesthesia Evaluation  Patient identified by MRN, date of birth, ID band Patient awake    Reviewed: Allergy & Precautions, H&P , NPO status , Patient's Chart, lab work & pertinent test results, reviewed documented beta blocker date and time   History of Anesthesia Complications (+) PONV and history of anesthetic complications  Airway Mallampati: III  TM Distance: >3 FB Neck ROM: full    Dental no notable dental hx. (+) Teeth Intact   Pulmonary neg pulmonary ROS,    Pulmonary exam normal breath sounds clear to auscultation       Cardiovascular Exercise Tolerance: Good hypertension, + Past MI   Rhythm:regular Rate:Normal     Neuro/Psych PSYCHIATRIC DISORDERS Anxiety negative neurological ROS     GI/Hepatic Neg liver ROS, GERD  Medicated,  Endo/Other  negative endocrine ROS  Renal/GU negative Renal ROS  negative genitourinary   Musculoskeletal   Abdominal   Peds  Hematology negative hematology ROS (+)   Anesthesia Other Findings   Reproductive/Obstetrics negative OB ROS                             Anesthesia Physical Anesthesia Plan  ASA: III  Anesthesia Plan: General   Post-op Pain Management:    Induction:   PONV Risk Score and Plan: 3  Airway Management Planned:   Additional Equipment:   Intra-op Plan:   Post-operative Plan:   Informed Consent: I have reviewed the patients History and Physical, chart, labs and discussed the procedure including the risks, benefits and alternatives for the proposed anesthesia with the patient or authorized representative who has indicated his/her understanding and acceptance.     Dental Advisory Given  Plan Discussed with: CRNA  Anesthesia Plan Comments:         Anesthesia Quick Evaluation

## 2019-08-25 NOTE — Anesthesia Postprocedure Evaluation (Signed)
Anesthesia Post Note  Patient: Charlotte Woods  Procedure(s) Performed: ESOPHAGOGASTRODUODENOSCOPY (EGD) WITH PROPOFOL (N/A ) MALONEY DILATION (N/A )  Patient location during evaluation: PACU Anesthesia Type: General Level of consciousness: awake and alert and oriented Pain management: pain level controlled Vital Signs Assessment: post-procedure vital signs reviewed and stable Respiratory status: spontaneous breathing Cardiovascular status: blood pressure returned to baseline and stable Postop Assessment: no apparent nausea or vomiting Anesthetic complications: no     Last Vitals:  Vitals:   08/25/19 1238 08/25/19 1534  BP: 130/66 (!) 106/54  Pulse: 62 80  Resp: 18 17  Temp: 36.5 C (P) 36.7 C  SpO2: 96% 96%    Last Pain:  Vitals:   08/25/19 1534  TempSrc:   PainSc: (P) 0-No pain                 Magdalen Cabana

## 2019-08-25 NOTE — H&P (Signed)
@LOGO @   Primary Care Physician:  , FNP Primary Gastroenterologist:  Dr. Altamease Oiler  Pre-Procedure History & Physical: HPI:  Charlotte Woods is a 74 y.o. female here for further evaluation of esophageal dysphagia via EGD. GERD well-controlled on omeprazole 20 mg daily.  Past Medical History:  Diagnosis Date  . Anxiety   . Arthritis   . GERD (gastroesophageal reflux disease)   . Heart attack [facility]) 2010   [facility], silent  . HTN (hypertension)   . Interstitial cystitis   . PONV (postoperative nausea and vomiting)     Past Surgical History:  Procedure Laterality Date  . APPENDECTOMY    . carpel tunnel    . CHOLECYSTECTOMY    . COLONOSCOPY  10/2013   Dr. 11/2013: sigmoid diverticulosis  . COLONOSCOPY N/A 05/19/2015   Procedure: COLONOSCOPY;  Surgeon: 05/21/2015 Delron Comer, internal hemorrhoids, colonic diverticulosis.  Suspected patient experienced hemorrhoidal or diverticular bleed.  Recommended repeat colonoscopy in 10 years.  . ESOPHAGOGASTRODUODENOSCOPY N/A 05/19/2015   Procedure: ESOPHAGOGASTRODUODENOSCOPY (EGD);  Surgeon: 05/21/2015, MD; normal exam.  . hysterectomy  2009   complete. endometrial cancer  . TONSILLECTOMY      Prior to Admission medications   Medication Sig Start Date End Date Taking? Authorizing Provider  aspirin 81 MG tablet Take 81 mg by mouth daily.   Yes [provider]  baclofen (LIORESAL) 10 MG tablet Take 10 mg by mouth 4 (four) times daily.   Yes [provider]  Cyanocobalamin (B-12 PO) Take 1 tablet by mouth daily.    Yes [provider]  diclofenac (VOLTAREN) 75 MG EC tablet Take 75 mg by mouth 2 (two) times daily. 11/02/17  Yes [provider]  ELMIRON 100 MG capsule Take 100 mg by mouth 3 (three) times daily. Reported on 05/12/2015 03/25/15  Yes [provider]  GARLIC PO Take 1 tablet by mouth daily.    Yes [provider]  Ginger, Zingiber officinalis, (GINGER ROOT) 550 MG  CAPS Take 1 capsule by mouth daily.    Yes [provider]  Glucosamine 500 MG CAPS Take 1,000 mg by mouth in the morning and at bedtime.    Yes [provider]  hydrOXYzine (ATARAX/VISTARIL) 25 MG tablet Take 25 mg by mouth daily.  02/03/15  Yes [provider]  levothyroxine (SYNTHROID, LEVOTHROID) 50 MCG tablet Take 1 tablet by mouth daily. 11/06/17  Yes [provider]  lisinopril-hydrochlorothiazide (ZESTORETIC) 20-12.5 MG tablet Take 2 tablets by mouth daily.  04/15/15  Yes [provider]  Multiple Vitamin (MULTIVITAMIN) tablet Take 1 tablet by mouth every other day.   Yes [provider]  MYRBETRIQ 25 MG TB24 tablet Take 25 mg by mouth daily. 11/10/17  Yes [provider]  Omega-3 Fatty Acids (FISH OIL) 1200 MG CAPS Take 1,200 mg by mouth daily.    Yes [provider]  omeprazole (PRILOSEC) 20 MG capsule Take 20 mg by mouth daily.   Yes [provider]  sertraline (ZOLOFT) 50 MG tablet Take 2 tablets by mouth daily.  09/18/17  Yes [provider]    Allergies as of 06/25/2019 - Review Complete 06/25/2019  Allergen Reaction Noted  . Codeine Other (See Comments) 04/29/2015  . Penicillins Hives 04/29/2015  . Phenazopyridine Hives 04/29/2015  . Pravastatin Other (See Comments) 04/29/2015  . Septra [sulfamethoxazole-trimethoprim] Hives 04/29/2015  . Simvastatin Other (See Comments) 04/29/2015  . Zetia [ezetimibe] Other (See Comments) 04/29/2015    Family History  Problem Relation Age of Onset  . Endometrial cancer Mother        died old age, 51  . Lung cancer Father   . Colon cancer Neg Hx     Social History   Socioeconomic History  . Marital status: Divorced    Spouse name: Not on file  . Number of children: 2  . Years of education: Not on file  . Highest education level: Not on file  Occupational History  . Not on file  Tobacco Use  . Smoking status: Never Smoker  . Smokeless  tobacco: Never Used  Substance and Sexual Activity  . Alcohol use: No    Alcohol/week: 0.0 standard drinks  . Drug use: No  . Sexual activity: Not on file  Other Topics Concern  . Not on file  Social History Narrative  . Not on file   Social Determinants of Health   Financial Resource Strain:   . Difficulty of Paying Living Expenses:   Food Insecurity:   . Worried About Charity fundraiser in the Last Year:   . Arboriculturist in the Last Year:   Transportation Needs:   . Film/video editor (Medical):   Marland Kitchen Lack of Transportation (Non-Medical):   Physical Activity:   . Days of Exercise per Week:   . Minutes of Exercise per Session:   Stress:   . Feeling of Stress :   Social Connections:   . Frequency of Communication with Friends and Family:   . Frequency of Social Gatherings with Friends and Family:   . Attends Religious Services:   . Active Member of Clubs or Organizations:   . Attends Archivist Meetings:   Marland Kitchen Marital Status:   Intimate Partner Violence:   . Fear of Current or Ex-Partner:   . Emotionally Abused:   Marland Kitchen Physically Abused:   . Sexually Abused:     Review of Systems: See HPI, otherwise negative ROS  Physical Exam: BP 130/66   Pulse 62   Temp 97.7 F (36.5 C) (Oral)   Resp 18   SpO2 96%  General:   Alert,  Well-developed, well-nourished, pleasant and cooperative in NAD Neck:  Supple; no masses or thyromegaly. No significant cervical adenopathy. Lungs:  Clear throughout to auscultation.   No wheezes, crackles, or rhonchi. No acute distress. Heart:  Regular rate and rhythm; no murmurs, clicks, rubs,  or gallops. Abdomen: Non-distended, normal bowel sounds.  Soft and nontender without appreciable mass or hepatosplenomegaly.  Pulses:  Normal pulses noted. Extremities:  Without clubbing or edema.  Impression/Plan: 74 year old lady with longstanding GERD well-controlled on omeprazole now with vague insidiously progressive esophageal  dysphagia to solids.  I have offered the patient an  EGD with potential esophageal dilation as feasible/appropriate per plan.  The risks, benefits, limitations, alternatives and imponderables have been reviewed with the patient. Potential for esophageal dilation, biopsy, etc. have also been reviewed.  Questions have been answered. All parties agreeable.      Notice: This dictation was prepared with Dragon dictation along with smaller phrase technology. Any transcriptional errors that result from this process are unintentional and may not be corrected upon review.

## 2019-08-25 NOTE — Discharge Instructions (Signed)
EGD Discharge instructions Please read the instructions outlined below and refer to this sheet in the next few weeks. These discharge instructions provide you with general information on caring for yourself after you leave the hospital. Your doctor may also give you specific instructions. While your treatment has been planned according to the most current medical practices available, unavoidable complications occasionally occur. If you have any problems or questions after discharge, please call your doctor. ACTIVITY  You may resume your regular activity but move at a slower pace for the next 24 hours.   Take frequent rest periods for the next 24 hours.   Walking will help expel (get rid of) the air and reduce the bloated feeling in your abdomen.   No driving for 24 hours (because of the anesthesia (medicine) used during the test).   You may shower.   Do not sign any important legal documents or operate any machinery for 24 hours (because of the anesthesia used during the test).  NUTRITION  Drink plenty of fluids.   You may resume your normal diet.   Begin with a light meal and progress to your normal diet.   Avoid alcoholic beverages for 24 hours or as instructed by your caregiver.  MEDICATIONS  You may resume your normal medications unless your caregiver tells you otherwise.  WHAT YOU CAN EXPECT TODAY  You may experience abdominal discomfort such as a feeling of fullness or "gas" pains.  FOLLOW-UP  Your doctor will discuss the results of your test with you.  SEEK IMMEDIATE MEDICAL ATTENTION IF ANY OF THE FOLLOWING OCCUR:  Excessive nausea (feeling sick to your stomach) and/or vomiting.   Severe abdominal pain and distention (swelling).   Trouble swallowing.   Temperature over 101 F (37.8 C).   Rectal bleeding or vomiting of blood.   GERD information provided  Increase omeprazole to 40 mg daily  Office visit with [place] in 6 weeks  At patient request I called Dorathy Kinsman at [phone] -rolled to voicemail

## 2019-08-25 NOTE — Op Note (Signed)
[facility] Patient Name: Charlotte Woods Procedure Date: 08/25/2019 2:40 PM MRN: [id number] Date of Birth: 1945/06/14 Attending MD: Gennette Pac , MD CSN: [id number] Age: 74 Admit Type: Outpatient Procedure:                Upper GI endoscopy Indications:              Dysphagia Providers:                Gennette Pac, MD, Jannett Celestine, RN, Edythe Clarity, Technician Referring MD:              Medicines:                Propofol per Anesthesia Complications:            No immediate complications. Estimated Blood Loss:     Estimated blood loss: none. Procedure:                Pre-Anesthesia Assessment:                           - Prior to the procedure, a History and Physical                            was performed, and patient medications and                            allergies were reviewed. The patient's tolerance of                            previous anesthesia was also reviewed. The risks                            and benefits of the procedure and the sedation                            options and risks were discussed with the patient.                            All questions were answered, and informed consent                            was obtained. Prior Anticoagulants: The patient has                            taken no previous anticoagulant or antiplatelet                            agents. ASA Grade Assessment: II - A patient with                            mild systemic disease. After reviewing the risks  and benefits, the patient was deemed in                            satisfactory condition to undergo the procedure.                           After obtaining informed consent, the endoscope was                            passed under direct vision. Throughout the                            procedure, the patient's blood pressure, pulse, and                            oxygen saturations were monitored  continuously. The                            [id number]-H190 ([id number]) scope was introduced through the                            mouth, and advanced to the second part of duodenum.                            The upper GI endoscopy was accomplished without                            difficulty. The patient tolerated the procedure                            well. Scope In: 3:17:13 PM Scope Out: 3:26:39 PM Total Procedure Duration: 0 hours 9 minutes 26 seconds  Findings:      The examined esophagus was normal. The scope was withdrawn. Dilation was       performed with a Maloney dilator with mild resistance at 54 Fr. The       dilation site was examined following endoscope reinsertion and showed no       change. Estimated blood loss: none.      The entire examined stomach was normal.      The duodenal bulb and second portion of the duodenum were normal. Impression:               - Normal esophagus. Dilated.                           - Normal stomach.                           - Normal duodenal bulb and second portion of the                            duodenum.                           - No specimens collected. Moderate Sedation:      Moderate (conscious) sedation was personally administered  by an       anesthesia professional. The following parameters were monitored: oxygen       saturation, heart rate, blood pressure, respiratory rate, EKG, adequacy       of pulmonary ventilation, and response to care. Recommendation:           - Patient has a contact number available for                            emergencies. The signs and symptoms of potential                            delayed complications were discussed with the                            patient. Return to normal activities tomorrow.                            Written discharge instructions were provided to the                            patient.                           - Resume previous diet.                           - Continue  present medications.                           - Return to my office in 6 weeks. Procedure Code(s):        --- Professional ---                           [phone], Esophagogastroduodenoscopy, flexible,                            transoral; diagnostic, including collection of                            specimen(s) by brushing or washing, when performed                            (separate procedure)                           43450, Dilation of esophagus, by unguided sound or                            bougie, single or multiple passes Diagnosis Code(s):        --- Professional ---                           R13.10, Dysphagia, unspecified CPT copyright 2019 American Medical Association. All rights reserved. The codes documented in this report are preliminary and upon coder review may  be revised to meet current compliance requirements. Cristopher Estimable. Bernadetta Roell, MD Norvel Richards,  MD 08/25/2019 4:38:09 PM This report has been signed electronically. Number of Addenda: 0

## 2019-08-25 NOTE — Transfer of Care (Signed)
Immediate Anesthesia Transfer of Care Note  Patient: Charlotte Woods  Procedure(s) Performed: ESOPHAGOGASTRODUODENOSCOPY (EGD) WITH PROPOFOL (N/A ) MALONEY DILATION (N/A )  Patient Location: PACU  Anesthesia Type:General  Level of Consciousness: awake  Airway & Oxygen Therapy: Patient Spontanous Breathing  Post-op Assessment: Report given to RN  Post vital signs: Reviewed and stable  Last Vitals:  Vitals Value Taken Time  BP 106/54 08/25/19 1532  Temp    Pulse 80 08/25/19 1533  Resp 17 08/25/19 1534  SpO2 96 % 08/25/19 1533  Vitals shown include unvalidated device data.  Last Pain:  Vitals:   08/25/19 1238  TempSrc: Oral  PainSc: 0-No pain      Patients Stated Pain Goal: 8 (08/25/19 1238)  Complications: No apparent anesthesia complications

## 2019-09-03 ENCOUNTER — Other Ambulatory Visit (HOSPITAL_COMMUNITY): Payer: Self-pay | Admitting: Family

## 2019-09-03 DIAGNOSIS — Z1231 Encounter for screening mammogram for malignant neoplasm of breast: Secondary | ICD-10-CM

## 2019-09-12 ENCOUNTER — Other Ambulatory Visit: Payer: Self-pay

## 2019-09-12 ENCOUNTER — Ambulatory Visit (HOSPITAL_COMMUNITY)
Admission: RE | Admit: 2019-09-12 | Discharge: 2019-09-12 | Disposition: A | Discharge status: Home / Self Care | Payer: Medicare Other | Source: Ambulatory Visit | Attending: Family | Admitting: Family

## 2019-09-12 DIAGNOSIS — Z1231 Encounter for screening mammogram for malignant neoplasm of breast: Secondary | ICD-10-CM | POA: Diagnosis present

## 2019-09-24 ENCOUNTER — Encounter: Payer: Self-pay | Admitting: Internal Medicine

## 2019-11-02 ENCOUNTER — Encounter: Payer: Self-pay | Admitting: Gastroenterology

## 2019-11-02 NOTE — Progress Notes (Signed)
Referring Provider: Altamease Oiler, FNP Primary Care Physician:  Altamease Oiler, FNP Primary GI Physician: Dr. Jena Gauss  Chief Complaint  Patient presents with  . Nausea    occ vomiting  . decreased appetite    HPI:   Charlotte Woods is a 74 y.o. female presenting today for follow-up s/p EGD.  Patient was last seen in our office in March 2021 for GERD, early satiety, dysphagia, weight loss, and constipation.  She had been struggling with GERD symptoms but this was much improved with avoiding chocolate and tomato sauces.  She did note worsening sensation of feeling foods going down her esophagus slowly.  Also reported early satiety with 20+ pound weight loss in 3 months.  Denied nausea, vomiting, or abdominal pain.  Hard BMs every other day with no BRBPR or melena.  Colonoscopy in 2017 with internal hemorrhoids and colonic diverticulosis.  Colonoscopy had been performed due to rectal bleeding and it was suspected that patient had experienced hemorrhoidal or diverticular bleed.  Due to significant upper GI symptoms and weight loss, she was scheduled for EGD +/- dilation and labs ordered, otherwise advised to continue omeprazole 20 mg.  For constipation, she was to start MiraLAX daily.   Labs completed 08/21/2019: CBC essentially normal, CMP with slightly elevated glucose at 131, albumin slightly low at 3.4.  TSH within normal limits. EGD 08/25/2019: Normal examined esophagus s/p dilation, normal stomach, normal examined duodenum.  Advised to continue present medications and follow-up in 6 weeks.  Today:  Dysphagia: Trouble swallowing has resolved.   Weight loss: Down 12 lbs since March 2021. Notably, used to be 204 in July 2019.   GERD: Well controlled as long as she avoids dietary triggers. Still taking omeprazole 20 mg daily. Needs a new prescription. She has been taking her husbands.   Most of the time she is nauseated to the point that she can't eat. Nausea started a couple of weeks  ago. Daily. Has vomited twice in the last few weeks. No blood. This is keeping her from eating very much. Eating about 1 meal/day. Nausea starts after eating. Continues with mild early satiety/decreased appetite. Still feels full at dinner. Typically eats eggs and toast for brunch. May have a biscuit, watermelon, or cantelope. Will take rollaids for nausea which helps at times. No new medications. No history of diabetes.  She does take diclofenac chronically.  Constipation: Alternating constipation and diarrhea.  [place] couple days with small, hard BMs which will later be followed by a couple days of mushy to watery stools with max 5 BMs per day.  Associated urgency with diarrhea.  Alternating constipation and diarrhea is fairly new and started for couple weeks ago.  No abdominal pain. No antibiotics recently.  Never started MiraLAX. No blood in the stool. No black stool unless taking pepto bismol.    Past Medical History:  Diagnosis Date  . Anxiety   . Arthritis   . GERD (gastroesophageal reflux disease)   . Heart attack [facility]) 2010   [facility] [address], silent  . HTN (hypertension)   . Interstitial cystitis   . PONV (postoperative nausea and vomiting)     Past Surgical History:  Procedure Laterality Date  . APPENDECTOMY    . carpel tunnel    . CHOLECYSTECTOMY    . COLONOSCOPY  10/2013   Dr. Aleene Davidson: sigmoid diverticulosis  . COLONOSCOPY N/A 05/19/2015   Procedure: COLONOSCOPY;  Surgeon: Gerrit Friends Rourk, internal hemorrhoids, colonic diverticulosis.  Suspected patient experienced  hemorrhoidal or diverticular bleed.  Recommended repeat colonoscopy in 10 years.  . ESOPHAGOGASTRODUODENOSCOPY N/A 05/19/2015   Procedure: ESOPHAGOGASTRODUODENOSCOPY (EGD);  Surgeon: Corbin Ade, MD; normal exam.  . ESOPHAGOGASTRODUODENOSCOPY (EGD) WITH PROPOFOL N/A 08/25/2019   Procedure: ESOPHAGOGASTRODUODENOSCOPY (EGD) WITH PROPOFOL;  Surgeon: Corbin Ade, MD;  Normal examined esophagus s/p dilation,  normal stomach, normal examined duodenum.  . hysterectomy  2009   complete. endometrial cancer  . MALONEY DILATION N/A 08/25/2019   Procedure: Elease Hashimoto DILATION;  Surgeon: Corbin Ade, MD;  Location: [facility];  Service: Endoscopy;  Laterality: N/A;  . TONSILLECTOMY      Current Outpatient Medications  Medication Sig Dispense Refill  . aspirin 81 MG tablet Take 81 mg by mouth daily.    . baclofen (LIORESAL) 10 MG tablet Take 10 mg by mouth 4 (four) times daily as needed.     . Cyanocobalamin (B-12 PO) Take 1 tablet by mouth daily.     . diclofenac (VOLTAREN) 75 MG EC tablet Take 75 mg by mouth 2 (two) times daily.  0  . ELMIRON 100 MG capsule Take 100 mg by mouth 3 (three) times daily. Reported on 05/12/2015    . GARLIC PO Take 1 tablet by mouth daily.     . Ginger, Zingiber officinalis, (GINGER ROOT) 550 MG CAPS Take 1 capsule by mouth daily.     . Glucosamine 500 MG CAPS Take 1,000 mg by mouth in the morning and at bedtime.     . hydrOXYzine (ATARAX/VISTARIL) 25 MG tablet Take 25 mg by mouth daily.     Marland Kitchen levothyroxine (SYNTHROID, LEVOTHROID) 50 MCG tablet Take 1 tablet by mouth daily.  0  . lisinopril-hydrochlorothiazide (ZESTORETIC) 20-12.5 MG tablet Take 2 tablets by mouth daily.     . Multiple Vitamin (MULTIVITAMIN) tablet Take 1 tablet by mouth every other day.    Marland Kitchen MYRBETRIQ 25 MG TB24 tablet Take 25 mg by mouth daily.  2  . Omega-3 Fatty Acids (FISH OIL) 1200 MG CAPS Take 1,200 mg by mouth daily.     Marland Kitchen omeprazole (PRILOSEC) 20 MG capsule Take 1 capsule (20 mg total) by mouth daily. 30 capsule 5  . sertraline (ZOLOFT) 50 MG tablet Take 2 tablets by mouth daily.     . ondansetron (ZOFRAN) 4 MG tablet Take 1 tablet (4 mg total) by mouth every 8 (eight) hours as needed for nausea or vomiting. 30 tablet 2   No current facility-administered medications for this visit.    Allergies as of 11/03/2019 - Review Complete 11/03/2019  Allergen Reaction Noted  . Codeine Other (See  Comments) 04/29/2015  . Penicillins Hives 04/29/2015  . Phenazopyridine Hives 04/29/2015  . Pravastatin Other (See Comments) 04/29/2015  . Septra [sulfamethoxazole-trimethoprim] Hives 04/29/2015  . Simvastatin Other (See Comments) 04/29/2015  . Zetia [ezetimibe] Other (See Comments) 04/29/2015    Family History  Problem Relation Age of Onset  . Endometrial cancer Mother        died old age, 23  . Lung cancer Father   . Colon cancer Neg Hx   . Stomach cancer Neg Hx   . Pancreatic cancer Neg Hx     Social History   Socioeconomic History  . Marital status: Divorced    Spouse name: Not on file  . Number of children: 2  . Years of education: Not on file  . Highest education level: Not on file  Occupational History  . Not on file  Tobacco Use  . Smoking status:  Never Smoker  . Smokeless tobacco: Never Used  Substance and Sexual Activity  . Alcohol use: No    Alcohol/week: 0.0 standard drinks  . Drug use: No  . Sexual activity: Not on file  Other Topics Concern  . Not on file  Social History Narrative  . Not on file   Social Determinants of Health   Financial Resource Strain:   . Difficulty of Paying Living Expenses:   Food Insecurity:   . Worried About Programme researcher, broadcasting/film/video in the Last Year:   . Barista in the Last Year:   Transportation Needs:   . Freight forwarder (Medical):   Marland Kitchen Lack of Transportation (Non-Medical):   Physical Activity:   . Days of Exercise per Week:   . Minutes of Exercise per Session:   Stress:   . Feeling of Stress :   Social Connections:   . Frequency of Communication with Friends and Family:   . Frequency of Social Gatherings with Friends and Family:   . Attends Religious Services:   . Active Member of Clubs or Organizations:   . Attends Banker Meetings:   Marland Kitchen Marital Status:     Review of Systems: Gen: Denies fever, chills, cold or flulike symptoms, lightheadedness, dizziness, presyncope, syncope. CV: Denies  chest pain or palpitations. Resp: Denies dyspnea or cough. GI: See HPI Heme: See HPI  Physical Exam: BP 114/71   Pulse 65   Temp (!) 97.3 F (36.3 C) (Oral)   Ht 5\' 5"  (1.651 m)   Wt 168 lb (76.2 kg)   BMI 27.96 kg/m  General:   Alert and oriented. No distress noted. Pleasant and cooperative.  Head:  Normocephalic and atraumatic. Eyes:  Conjuctiva clear without scleral icterus. Heart:  S1, S2 present without murmurs appreciated. Lungs:  Clear to auscultation bilaterally. No wheezes, rales, or rhonchi. No distress.  Abdomen:  +BS, soft, non-tender and non-distended. No rebound or guarding. No HSM or masses noted. Msk:  Symmetrical without gross deformities. Normal posture. Extremities:  Without edema. Neurologic:  Alert and  oriented x4 Psych: Normal mood and affect.

## 2019-11-03 ENCOUNTER — Encounter: Payer: Self-pay | Admitting: Gastroenterology

## 2019-11-03 ENCOUNTER — Other Ambulatory Visit: Payer: Self-pay

## 2019-11-03 ENCOUNTER — Ambulatory Visit (INDEPENDENT_AMBULATORY_CARE_PROVIDER_SITE_OTHER): Payer: Medicare Other | Admitting: Gastroenterology

## 2019-11-03 ENCOUNTER — Other Ambulatory Visit: Payer: Self-pay | Admitting: *Deleted

## 2019-11-03 ENCOUNTER — Telehealth: Payer: Self-pay | Admitting: *Deleted

## 2019-11-03 VITALS — BP 114/71 | HR 65 | Temp 97.3°F | Ht 65.0 in | Wt 168.0 lb

## 2019-11-03 DIAGNOSIS — R634 Abnormal weight loss: Secondary | ICD-10-CM

## 2019-11-03 DIAGNOSIS — K219 Gastro-esophageal reflux disease without esophagitis: Secondary | ICD-10-CM

## 2019-11-03 DIAGNOSIS — R1310 Dysphagia, unspecified: Secondary | ICD-10-CM

## 2019-11-03 DIAGNOSIS — R112 Nausea with vomiting, unspecified: Secondary | ICD-10-CM | POA: Diagnosis not present

## 2019-11-03 DIAGNOSIS — R6881 Early satiety: Secondary | ICD-10-CM

## 2019-11-03 DIAGNOSIS — R197 Diarrhea, unspecified: Secondary | ICD-10-CM | POA: Insufficient documentation

## 2019-11-03 DIAGNOSIS — R198 Other specified symptoms and signs involving the digestive system and abdomen: Secondary | ICD-10-CM

## 2019-11-03 MED ORDER — OMEPRAZOLE 20 MG PO CPDR: 20.0000 mg | DELAYED_RELEASE_CAPSULE | Freq: Every day | ORAL | 5 refills | Status: DC

## 2019-11-03 MED ORDER — ONDANSETRON HCL 4 MG PO TABS: 4.0000 mg | ORAL_TABLET | Freq: Three times a day (TID) | ORAL | 2 refills | Status: DC | PRN

## 2019-11-03 NOTE — Telephone Encounter (Signed)
CT A/P scheduled for 11/11/2019 arrival 11:45am, npo 4 hrs prior, p/u oral prep, needs labs done prior.  LMOVM for pt

## 2019-11-03 NOTE — Patient Instructions (Signed)
We will help arrange for you to have a CT of your abdomen and pelvis in the very near future.  I am sending in Zofran 4 mg.  You may take this every 8 hours as needed for nausea and vomiting.  Continue taking omeprazole 20 mg daily.  Take this 30 minutes before your first meal.  I have sent a new prescription to your pharmacy.  Follow a GERD diet:  Avoid fried, fatty, greasy, spicy, citrus foods. Avoid caffeine and carbonated beverages. Avoid chocolate. Try eating 4-6 small meals a day rather than 3 large meals. Do not eat within 3 hours of laying down. Prop head of bed up on wood or bricks to create a 6 inch incline.  Regarding alternating constipation and diarrhea: Add Benefiber or Metamucil daily to help with stool consistency. Ensure you are drinking enough water to keep your urine pale yellow to clear. Please let me know if diarrhea does not improve or if it becomes more persistent.  We would need to consider checking your stool for infectious causes.  I will have further recommendations to follow your CT scan.  I will place a follow-up temporarily for 2 months.  We may adjust this if needed.  Ermalinda Memos, PA-C [facility]

## 2019-11-03 NOTE — Telephone Encounter (Signed)
LMOVM

## 2019-11-04 NOTE — Telephone Encounter (Signed)
Called spoke with pt. She is aware of CT appt details. She will p/u oral contrast atleast 1 day prior and will go and have lab work done as well. Orders placed.

## 2019-11-05 NOTE — Assessment & Plan Note (Addendum)
Change in bowel habits from chronic constipation to alternating constipation and diarrhea about 2 weeks ago.  She will couple of days with small hard BMs followed by a couple of days of mushy to watery BMs with max of 5 BMs per day.  Associated urgency with diarrhea.  Denies associated abdominal pain, BRBPR, or melena.  Denies recent medication changes or antibiotics. Last colonoscopy in 2017 with diverticulosis and internal hemorrhoids with recommendations to repeat in 10 years. Notably, patient has also had 12 pound weight loss over the last 4 months, persistent early satiety x6+ months, and new onset of nausea with occasional vomiting x2 weeks.  Change in bowel habits may be benign and related to underlying constipation with some overflow diarrhea once bowels start moving; however, considering constellation of GI symptoms and weight loss, I am concerned for possible occult malignancy including pancreatic lesion.  Plan: CT A/P with contrast ASAP. Add Benefiber or Metamucil daily to help with stool consistency. Drink enough water to keep urine pale yellow to clear. Requested patient to let me know if diarrhea does not improve or if it becomes more persistent as we would need to consider stool studies/additional work up. Tentatively plan to follow-up in 2 months.

## 2019-11-05 NOTE — Assessment & Plan Note (Signed)
Addressed under nausea with vomiting 

## 2019-11-05 NOTE — Assessment & Plan Note (Addendum)
74 year old female with new onset of persistent nausea with occasional vomiting x2 weeks.  Also with persistent early satiety and decreased appetite for which she was also evaluated in March 2021.  Underwent EGD 08/25/2019 revealing normal esophagus s/p empiric dilation, normal stomach, normal examined duodenum.  Notably, patient has lost 12 pounds since March 2021.  Denies abdominal pain.  GERD symptoms are well controlled with omeprazole 20 mg daily and avoiding dietary triggers. Notably, she also reports change in bowel habits from constipation, now with alternating constipation and diarrhea.  Denies bright red blood per rectum or melena.  Denies new medications or recent antibiotics.  No history of diabetes.   Considering early satiety and nausea, query whether patient has gastroparesis/delayed gastric emptying.  However, as she also has change in bowel habits and significant weight loss, feel we need to pursue CT A/P to evaluate for occult malignancy including pancreatic lesion.  Plan: Proceed with CT A/P with contrast ASAP Zofran 4 mg as needed every 8 hours for nausea and vomiting. Continue omeprazole 20 mg daily 30 minutes before breakfast. Continue to follow GERD diet.  Counseled on this. Add Benefiber or Metamucil to help with stool consistency. Further recommendations to follow CT. Tentatively planning to follow-up in 2 months.

## 2019-11-05 NOTE — Assessment & Plan Note (Signed)
Resolved s/p EGD with empiric dilation.  Notably, her esophagus appeared normal on exam.  We will continue to monitor for return of symptoms.

## 2019-11-05 NOTE — Assessment & Plan Note (Signed)
12 pound weight loss since March 2021.  Additional symptoms significant for persistent early satiety x6+ months, decreased appetite, new onset nausea with occasional vomiting x2 weeks, and change in bowel habits from constipation to alternating constipation and diarrhea x2 weeks.  Denies abdominal pain, bright red blood per rectum, or melena.  EGD 08/25/2019 with normal esophagus s/p empiric dilation, normal stomach, normal examined duodenum.  Last colonoscopy in 2017 with internal hemorrhoids and colonic diverticulosis with recommendations to repeat in 10 years.  Considering her constellation of symptoms, I am concerned for possible occult malignancy including pancreatic lesion.  Change in bowel habits could be benign secondary to primary underlying constipation.  Upper GI symptoms could also be secondary to gastroparesis.   We will plan to pursue CT A/P with contrast first to rule out occult malignancy.  Prescription sent for Zofran 4 mg every 8 hours as needed for nausea/vomiting. Advised to add Benefiber or Metamucil to help with stool consistency. Continue omeprazole 20 mg daily. Continue to follow a GERD diet.  Counseled on this. Further recommendations to follow CT. Tentatively plan to follow-up in 2 months.

## 2019-11-05 NOTE — Assessment & Plan Note (Addendum)
Typical GERD symptoms are well controlled on omeprazole 20 mg daily and avoiding dietary triggers.  Advise she continue her current medications.  Also counseled on GERD diet and information was provided on this.  Due to new onset of nausea with occasional vomiting and ongoing early satiety with weight loss, we are planning to pursue CT A/P with contrast as discussed below.  Further recommendations to follow.  Tentatively planning to follow-up in 2 months.

## 2019-11-07 LAB — BUN: BUN: 21 mg/dL (ref 7–25)

## 2019-11-07 LAB — CREATININE, SERUM: Creat: 1.07 mg/dL — ABNORMAL HIGH (ref 0.60–0.93)

## 2019-11-11 ENCOUNTER — Ambulatory Visit (HOSPITAL_COMMUNITY)
Admission: RE | Admit: 2019-11-11 | Discharge: 2019-11-11 | Disposition: A | Discharge status: Home / Self Care | Payer: Medicare Other | Source: Ambulatory Visit | Attending: Gastroenterology | Admitting: Gastroenterology

## 2019-11-11 ENCOUNTER — Other Ambulatory Visit: Payer: Self-pay

## 2019-11-11 DIAGNOSIS — R6881 Early satiety: Secondary | ICD-10-CM | POA: Diagnosis present

## 2019-11-11 DIAGNOSIS — R112 Nausea with vomiting, unspecified: Secondary | ICD-10-CM | POA: Insufficient documentation

## 2019-11-11 DIAGNOSIS — R634 Abnormal weight loss: Secondary | ICD-10-CM | POA: Diagnosis present

## 2019-11-11 MED ORDER — IOHEXOL 300 MG/ML  SOLN
100.0000 mL | Freq: Once | INTRAMUSCULAR | Status: AC | PRN
  Administered 2019-11-11: 100 mL via INTRAVENOUS

## 2019-11-12 NOTE — Progress Notes (Signed)
No findings on CT to explain weight loss, early satiety, and nausea with intermittent vomiting.   She has a moderate amount of stool in her colon.  There is also gas visualized in the vagina with inability to exclude a fistula between the vagina and rectum.   Recommendations:  1. GES to evaluate for gastroparesis (delayed stomach emptying) as possible cause of early satiety, nausea, vomiting, and weight loss. ([facility] Clinical Pool please arrange) 2. Add MiraLAX 1 capful (17g) daily in 8 oz of water. Her alternating constipation and diarrhea is likely secondary to constipation with overflow diarrhea. We need to get her bowels moving well every day.  3. She needs to see OBGYN to evaluate possible rectovaginal fistula further. Does she have an OBGYN she has seen in the past? We can place referral and have CT results sent with the referral.

## 2019-11-13 ENCOUNTER — Other Ambulatory Visit: Payer: Self-pay | Admitting: *Deleted

## 2019-11-13 DIAGNOSIS — R112 Nausea with vomiting, unspecified: Secondary | ICD-10-CM

## 2019-11-13 DIAGNOSIS — R198 Other specified symptoms and signs involving the digestive system and abdomen: Secondary | ICD-10-CM

## 2019-11-13 DIAGNOSIS — R634 Abnormal weight loss: Secondary | ICD-10-CM

## 2019-11-13 DIAGNOSIS — R6881 Early satiety: Secondary | ICD-10-CM

## 2019-11-13 NOTE — Progress Notes (Signed)
Cc'ed to PCP 

## 2019-11-18 ENCOUNTER — Encounter (HOSPITAL_COMMUNITY): Payer: Medicare Other

## 2019-11-20 ENCOUNTER — Encounter (HOSPITAL_COMMUNITY)
Admission: RE | Admit: 2019-11-20 | Discharge: 2019-11-20 | Disposition: A | Discharge status: Home / Self Care | Payer: Medicare Other | Source: Ambulatory Visit | Attending: Gastroenterology | Admitting: Gastroenterology

## 2019-11-20 ENCOUNTER — Other Ambulatory Visit: Payer: Self-pay

## 2019-11-20 DIAGNOSIS — R112 Nausea with vomiting, unspecified: Secondary | ICD-10-CM | POA: Diagnosis present

## 2019-11-20 DIAGNOSIS — R6881 Early satiety: Secondary | ICD-10-CM | POA: Insufficient documentation

## 2019-11-20 DIAGNOSIS — R634 Abnormal weight loss: Secondary | ICD-10-CM | POA: Insufficient documentation

## 2019-11-20 MED ORDER — TECHNETIUM TC 99M SULFUR COLLOID
2.0000 | Freq: Once | INTRAVENOUS | Status: AC | PRN
  Administered 2019-11-20: 1.98 via INTRAVENOUS

## 2019-11-20 NOTE — Progress Notes (Signed)
GES within normal limits.  Please see how patient is doing.   Has she continued with nausea/vomiting?  Early satiety? Is she taking Zofran? Has she added MiraLAX daily?  How are her bowels moving?

## 2019-11-23 NOTE — Progress Notes (Signed)
As she feels she is  improving, we will continue to monitor for now.   Ok to hold off on adding MiraLAX daily but I do recommend she go ahead and add Benefiber or Metamucil daily to help with bowel regularity and stool consistency.   We will follow-up as scheduled on 9/17. She should call us with any worsening symptoms, or other questions or concerns.

## 2019-12-02 ENCOUNTER — Ambulatory Visit (INDEPENDENT_AMBULATORY_CARE_PROVIDER_SITE_OTHER): Payer: Medicare Other | Admitting: Obstetrics & Gynecology

## 2019-12-02 ENCOUNTER — Encounter: Payer: Self-pay | Admitting: Obstetrics & Gynecology

## 2019-12-02 VITALS — BP 117/68 | HR 61 | Ht 64.5 in | Wt 165.0 lb

## 2019-12-02 DIAGNOSIS — K5901 Slow transit constipation: Secondary | ICD-10-CM | POA: Diagnosis not present

## 2019-12-02 NOTE — Progress Notes (Signed)
Chief Complaint  Patient presents with  . Possible Fissure      74 y.o. G2P0 No LMP recorded. Patient has had a hysterectomy. The current method of family planning is status post hysterectomy.  Outpatient Encounter Medications as of 12/02/2019  Medication Sig  . aspirin 81 MG tablet Take 81 mg by mouth daily.  . baclofen (LIORESAL) 10 MG tablet Take 10 mg by mouth 2 (two) times daily as needed.   . Cyanocobalamin (B-12 PO) Take 1 tablet by mouth daily.   Marland Kitchen ELMIRON 100 MG capsule Take 100 mg by mouth 3 (three) times daily. Reported on 05/12/2015  . GARLIC PO Take 1 tablet by mouth daily.   . Ginger, Zingiber officinalis, (GINGER ROOT) 550 MG CAPS Take 1 capsule by mouth daily.   . Glucosamine 500 MG CAPS Take 1,000 mg by mouth in the morning and at bedtime.   . hydrOXYzine (ATARAX/VISTARIL) 25 MG tablet Take 25 mg by mouth daily.   Marland Kitchen levothyroxine (SYNTHROID, LEVOTHROID) 50 MCG tablet Take 1 tablet by mouth daily.  Marland Kitchen lisinopril-hydrochlorothiazide (ZESTORETIC) 20-12.5 MG tablet Take 2 tablets by mouth daily.   . Multiple Vitamin (MULTIVITAMIN) tablet Take 1 tablet by mouth every other day.  Marland Kitchen MYRBETRIQ 25 MG TB24 tablet Take 25 mg by mouth daily.  . Omega-3 Fatty Acids (FISH OIL) 1200 MG CAPS Take 1,200 mg by mouth daily.   Marland Kitchen omeprazole (PRILOSEC) 20 MG capsule Take 1 capsule (20 mg total) by mouth daily.  . ondansetron (ZOFRAN) 4 MG tablet Take 1 tablet (4 mg total) by mouth every 8 (eight) hours as needed for nausea or vomiting.  . sertraline (ZOLOFT) 50 MG tablet Take 2 tablets by mouth daily.   . [DISCONTINUED] diclofenac (VOLTAREN) 75 MG EC tablet Take 75 mg by mouth 2 (two) times daily.   No facility-administered encounter medications on file as of 12/02/2019.    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  There was left-sided diverticulosis but nothing acute noted otherwise  Patient denies any gas coming from the vagina or any discharge foul-smelling from the vagina No fevers or chills otherwise Past Medical History:  Diagnosis Date  . Anxiety   . Arthritis   . GERD (gastroesophageal reflux disease)   . Heart attack [facility]) 2010   [facility] [address], silent  . HTN (hypertension)   . Interstitial cystitis   . PONV (postoperative nausea and vomiting)     Past Surgical History:  Procedure Laterality Date  . APPENDECTOMY    . BIOPSY  03/08/2020   Procedure: BIOPSY;  Surgeon: Lanelle Bal, DO;  Location: [facility];  Service: Endoscopy;;  . carpel tunnel    . CHOLECYSTECTOMY    . COLONOSCOPY  10/2013   Dr. Aleene Davidson: sigmoid diverticulosis  . COLONOSCOPY N/A 05/19/2015   Procedure: COLONOSCOPY;  Surgeon: Gerrit Friends Rourk, internal hemorrhoids, colonic diverticulosis.  Suspected patient experienced hemorrhoidal or diverticular bleed.  Recommended repeat colonoscopy in 10 years.  . COLONOSCOPY WITH PROPOFOL N/A 03/08/2020   Procedure: COLONOSCOPY WITH PROPOFOL;  Surgeon: Lanelle Bal, DO;  Location: [facility];  Service: Endoscopy;  Laterality: N/A;  3:00pm  . ESOPHAGOGASTRODUODENOSCOPY N/A 05/19/2015   Procedure: ESOPHAGOGASTRODUODENOSCOPY (EGD);  Surgeon: Corbin Ade, MD; normal exam.  . ESOPHAGOGASTRODUODENOSCOPY (EGD) WITH  PROPOFOL N/A 08/25/2019   Procedure: ESOPHAGOGASTRODUODENOSCOPY (EGD) WITH PROPOFOL;  Surgeon: Corbin Ade, MD;  Normal examined esophagus s/p dilation, normal stomach, normal examined duodenum.  . hysterectomy  2009   complete. endometrial cancer  . MALONEY DILATION N/A 08/25/2019   Procedure: Elease Hashimoto DILATION;  Surgeon: Corbin Ade, MD;  Location: [facility];  Service: Endoscopy;  Laterality: N/A;  . TONSILLECTOMY      OB History    Gravida  2   Para      Term      Preterm      AB      Living   2     SAB      IAB      Ectopic      Multiple      Live Births  2           Allergies  Allergen Reactions  . Codeine Other (See Comments)    Hallucinations   . Penicillins Hives  . Phenazopyridine Hives  . Pravastatin Other (See Comments)    Body aches   . Septra [Sulfamethoxazole-Trimethoprim] Hives  . Simvastatin Other (See Comments)    Body aches   . Zetia [Ezetimibe] Other (See Comments)    Muscle cramps     Social History   Socioeconomic History  . Marital status: Divorced    Spouse name: Not on file  . Number of children: 2  . Years of education: Not on file  . Highest education level: Not on file  Occupational History  . Not on file  Tobacco Use  . Smoking status: Never Smoker  . Smokeless tobacco: Never Used  Vaping Use  . Vaping Use: Never used  Substance and Sexual Activity  . Alcohol use: No    Alcohol/week: 0.0 standard drinks  . Drug use: No  . Sexual activity: Not Currently    Birth control/protection: Surgical  Other Topics Concern  . Not on file  Social History Narrative  . Not on file   Social Determinants of Health   Financial Resource Strain: Low Risk   . Difficulty of Paying Living Expenses: Not very hard  Food Insecurity: No Food Insecurity  . Worried About Programme researcher, broadcasting/film/video in the Last Year: Never true  . Ran Out of Food in the Last Year: Never true  Transportation Needs: No Transportation Needs  . Lack of Transportation (Medical): No  . Lack of Transportation (Non-Medical): No  Physical Activity: Insufficiently Active  . Days of Exercise per Week: 4 days  . Minutes of Exercise per Session: 20 min  Stress: No Stress Concern Present  . Feeling of Stress : Not at all  Social Connections: Moderately Isolated  . Frequency of Communication with Friends and Family: More than three times a week  . Frequency of Social Gatherings with Friends and Family: More than three times a week  . Attends Religious Services: Never   . Active Member of Clubs or Organizations: No  . Attends Banker Meetings: Never  . Marital Status: Married    Family History  Problem Relation Age of Onset  . Endometrial cancer Mother        died old age, 42  . Lung cancer Father   . Colon cancer Neg Hx   . Stomach cancer Neg Hx   . Pancreatic cancer Neg Hx   . Inflammatory bowel disease Neg Hx     Medications:       Current Outpatient Medications:  .  aspirin 81 MG tablet, Take 81 mg by mouth daily., Disp: , Rfl:  .  baclofen (LIORESAL) 10 MG tablet, Take 10 mg by mouth 2 (two) times daily as needed. , Disp: , Rfl:  .  Cyanocobalamin (B-12 PO), Take 1 tablet by mouth daily. , Disp: , Rfl:  .  ELMIRON 100 MG capsule, Take 100 mg by mouth 3 (three) times daily. Reported on 05/12/2015, Disp: , Rfl:  .  GARLIC PO, Take 1 tablet by mouth daily. , Disp: , Rfl:  .  Ginger, Zingiber officinalis, (GINGER ROOT) 550 MG CAPS, Take 1 capsule by mouth daily. , Disp: , Rfl:  .  Glucosamine 500 MG CAPS, Take 1,000 mg by mouth in the morning and at bedtime. , Disp: , Rfl:  .  hydrOXYzine (ATARAX/VISTARIL) 25 MG tablet, Take 25 mg by mouth daily. , Disp: , Rfl:  .  levothyroxine (SYNTHROID, LEVOTHROID) 50 MCG tablet, Take 1 tablet by mouth daily., Disp: , Rfl: 0 .  lisinopril-hydrochlorothiazide (ZESTORETIC) 20-12.5 MG tablet, Take 2 tablets by mouth daily. , Disp: , Rfl:  .  Multiple Vitamin (MULTIVITAMIN) tablet, Take 1 tablet by mouth every other day., Disp: , Rfl:  .  MYRBETRIQ 25 MG TB24 tablet, Take 25 mg by mouth daily., Disp: , Rfl: 2 .  Omega-3 Fatty Acids (FISH OIL) 1200 MG CAPS, Take 1,200 mg by mouth daily. , Disp: , Rfl:  .  omeprazole (PRILOSEC) 20 MG capsule, Take 1 capsule (20 mg total) by mouth daily., Disp: 30 capsule, Rfl: 5 .  ondansetron (ZOFRAN) 4 MG tablet, Take 1 tablet (4 mg total) by mouth every 8 (eight) hours as needed for nausea or vomiting., Disp: 30 tablet, Rfl: 2 .  sertraline (ZOLOFT) 50 MG tablet,  Take 2 tablets by mouth daily. , Disp: , Rfl:  .  bismuth subsalicylate (PEPTO BISMOL) 262 MG/15ML suspension, Take 30 mLs by mouth every 6 (six) hours as needed., Disp: , Rfl:  .  dicyclomine (BENTYL) 10 MG capsule, Take 1 capsule (10 mg total) by mouth 4 (four) times daily -  before meals and at bedtime for 14 days., Disp: 56 capsule, Rfl: 0 .  loperamide (IMODIUM) 2 MG capsule, Take 1 capsule (2 mg total) by mouth as needed for diarrhea or loose stools., Disp: 30 capsule, Rfl: 2  Objective Blood pressure 117/68, pulse 61, height 5' 4.5" (1.638 m), weight 165 lb (74.8 kg).  General WDWN female NAD Vulva:  normal appearing vulva with no masses, tenderness or lesions Vagina:  normal mucosa, no discharge Cervix:  absent Uterus:  absent Adnexa: ovaries:present,  normal adnexa i   Pertinent ROS No burning with urination, frequency or urgency No nausea, vomiting or diarrhea Nor fever chills or other constitutional symptoms   Labs or studies     Impression Diagnoses this Encounter::   ICD-10-CM   1. Slow transit constipation  K59.01     Established relevant diagnosis(es):   Plan/Recommendations: No orders of the defined types were placed in this encounter.   Labs or Scans Ordered: No orders of the defined types were placed in this encounter.   Management:: No evidence of rectovaginal fistula I do recommend the patient be more mindful of her ongoing problems with constipation which probably makes her diverticulosis worse and could have possibly diverticulitis 1 days she is aware  Follow-up as needed  Follow up No follow-ups on file.     .   All questions were answered.

## 2020-01-08 NOTE — Progress Notes (Signed)
Referring Provider: Altamease Oiler, FNP Primary Care Physician:  Altamease Oiler, FNP Primary GI Physician: Dr. Jena Gauss  Chief Complaint  Patient presents with  . Diarrhea    getting worse, multiple times per day now, unable to leave house    HPI:   Charlotte Woods is a 74 y.o. female with history of dysphagia s/p dilation in May 2021, GERD, early satiety, nausea, weight loss, and constipation.  Colonoscopy up-to-date in 2017 with internal hemorrhoids and colonic diverticulosis, recommended repeat in 10 years (per telephone note 11/03/2015).  Last EGD May 2021 with normal exam s/p esophageal dilation.  She is presenting today for follow-up.  Last seen in our office 11/03/2019.  Dysphagia had resolved.  GERD was well controlled on omeprazole 20 mg daily.  She continued with weight loss and was down 12 pounds since March 2021.  Noted new onset of frequent nausea which is started a couple weeks prior and was keeping her from eating.  Couple episodes of vomiting.  Noted Rolaids seem to help nausea at times. Continued with mild early satiety/decreased appetite.  Also with new onset alternating constipation and diarrhea with associated urgency.  No BRBPR or melena.  Plan for CT A/P with contrast, Zofran for nausea, continue omeprazole, add Benefiber or Metamucil for stool consistency  CT 11/11/2019: Moderate stool burden throughout the colon, gas within the vagina with indistinct planes between the vagina and rectum with inability to exclude fistula.  Recommended gynecologic and digital rectal exam.    Planed for GES to evaluate for gastroparesis, MiraLAX for constipation, and refer to OB/GYN.  Gastric emptying study 11/20/2019: Normal. Patient noted she was not having as much nausea or early satiety and was not using Zofran as she did not feel she needed it. She had not started MiraLAX as she had some diarrhea that settled out midday, 3 BMs with the diarrhea.  She was not taking antidiarrheal  medications.  She was advised to at least go ahead and start Benefiber or Metamucil daily.  Appointment with OB/GYN 12/02/2019: Note has not been completed.    Today:  GERD: Well controlled.   Weight loss: Additional 4 lb weight loss. Eating 2 meals a day. Frozen biscuit around lunch time. Then dinner. No nausea or vomiting. Mild early satiety. States she just doesn't get hungry until lunch. Also limiting intake now due to diarrhea.   Diarrhea: Having abut 4-5 large watery BMs a day. Diarrhea has become persistent over the last month. Not taking anything for diarrhea. Occasional abdominal pain prior to the BM but not routinely. Nocturnal stool maybe once or twice. No blood in the stool. No black stool.  Saw OBGYN, had pelvic exam and rectal exam and was told there were no abnormalities. She doesn't need to follow-up with them.   No recent antibiotics. No well water. No sick contacts. No travel.   Takes diclofenac twice daily.   No fever, chills, lightheadedness, dizziness, pre-syncope, or syncope.   Patient reports having labs completed with cardiologist a month or so ago and was told she is anemic.   Past Medical History:  Diagnosis Date  . Anxiety   . Arthritis   . GERD (gastroesophageal reflux disease)   . 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 HTN (hypertension)   . Interstitial cystitis   . PONV (postoperative nausea and vomiting)     Past Surgical History:  Procedure Laterality Date  . APPENDECTOMY    . carpel  tunnel    . CHOLECYSTECTOMY    . COLONOSCOPY  10/2013   Dr. Aleene Davidson: sigmoid diverticulosis  . COLONOSCOPY N/A 05/19/2015   Procedure: COLONOSCOPY;  Surgeon: Gerrit Friends Rourk, internal hemorrhoids, colonic diverticulosis.  Suspected patient experienced hemorrhoidal or diverticular bleed.  Recommended repeat colonoscopy in 10 years.  . ESOPHAGOGASTRODUODENOSCOPY N/A 05/19/2015   Procedure: ESOPHAGOGASTRODUODENOSCOPY (EGD);  Surgeon: Corbin Ade, MD;  normal exam.  . ESOPHAGOGASTRODUODENOSCOPY (EGD) WITH PROPOFOL N/A 08/25/2019   Procedure: ESOPHAGOGASTRODUODENOSCOPY (EGD) WITH PROPOFOL;  Surgeon: Corbin Ade, MD;  Normal examined esophagus s/p dilation, normal stomach, normal examined duodenum.  . hysterectomy  2009   complete. endometrial cancer  . MALONEY DILATION N/A 08/25/2019   Procedure: Elease Hashimoto DILATION;  Surgeon: Corbin Ade, MD;  Location: [facility];  Service: Endoscopy;  Laterality: N/A;  . TONSILLECTOMY      Current Outpatient Medications  Medication Sig Dispense Refill  . aspirin 81 MG tablet Take 81 mg by mouth daily.    . baclofen (LIORESAL) 10 MG tablet Take 10 mg by mouth 4 (four) times daily as needed.     . bismuth subsalicylate (PEPTO BISMOL) 262 MG/15ML suspension Take 30 mLs by mouth every 6 (six) hours as needed.    . Cyanocobalamin (B-12 PO) Take 1 tablet by mouth daily.     . diclofenac (VOLTAREN) 75 MG EC tablet Take 75 mg by mouth 2 (two) times daily.  0  . ELMIRON 100 MG capsule Take 100 mg by mouth 3 (three) times daily. Reported on 05/12/2015    . GARLIC PO Take 1 tablet by mouth daily.     . Ginger, Zingiber officinalis, (GINGER ROOT) 550 MG CAPS Take 1 capsule by mouth daily.     . Glucosamine 500 MG CAPS Take 1,000 mg by mouth in the morning and at bedtime.     . hydrOXYzine (ATARAX/VISTARIL) 25 MG tablet Take 25 mg by mouth daily.     Marland Kitchen levothyroxine (SYNTHROID, LEVOTHROID) 50 MCG tablet Take 1 tablet by mouth daily.  0  . lisinopril-hydrochlorothiazide (ZESTORETIC) 20-12.5 MG tablet Take 2 tablets by mouth daily.     . Multiple Vitamin (MULTIVITAMIN) tablet Take 1 tablet by mouth every other day.    Marland Kitchen MYRBETRIQ 25 MG TB24 tablet Take 25 mg by mouth daily.  2  . Omega-3 Fatty Acids (FISH OIL) 1200 MG CAPS Take 1,200 mg by mouth daily.     Marland Kitchen omeprazole (PRILOSEC) 20 MG capsule Take 1 capsule (20 mg total) by mouth daily. 30 capsule 5  . sertraline (ZOLOFT) 50 MG tablet Take 2 tablets by mouth  daily.     . ondansetron (ZOFRAN) 4 MG tablet Take 1 tablet (4 mg total) by mouth every 8 (eight) hours as needed for nausea or vomiting. (Patient not taking: Reported on 01/09/2020) 30 tablet 2   No current facility-administered medications for this visit.    Allergies as of 01/09/2020 - Review Complete 01/09/2020  Allergen Reaction Noted  . Codeine Other (See Comments) 04/29/2015  . Penicillins Hives 04/29/2015  . Phenazopyridine Hives 04/29/2015  . Pravastatin Other (See Comments) 04/29/2015  . Septra [sulfamethoxazole-trimethoprim] Hives 04/29/2015  . Simvastatin Other (See Comments) 04/29/2015  . Zetia [ezetimibe] Other (See Comments) 04/29/2015    Family History  Problem Relation Age of Onset  . Endometrial cancer Mother        died old age, 84  . Lung cancer Father   . Colon cancer Neg Hx   . Stomach cancer  Neg Hx   . Pancreatic cancer Neg Hx   . Inflammatory bowel disease Neg Hx     Social History   Socioeconomic History  . Marital status: Divorced    Spouse name: Not on file  . Number of children: 2  . Years of education: Not on file  . Highest education level: Not on file  Occupational History  . Not on file  Tobacco Use  . Smoking status: Never Smoker  . Smokeless tobacco: Never Used  Vaping Use  . Vaping Use: Never used  Substance and Sexual Activity  . Alcohol use: No    Alcohol/week: 0.0 standard drinks  . Drug use: No  . Sexual activity: Not Currently    Birth control/protection: Surgical  Other Topics Concern  . Not on file  Social History Narrative  . Not on file   Social Determinants of Health   Financial Resource Strain: Low Risk   . Difficulty of Paying Living Expenses: Not very hard  Food Insecurity: No Food Insecurity  . Worried About Programme researcher, broadcasting/film/video in the Last Year: Never true  . Ran Out of Food in the Last Year: Never true  Transportation Needs: No Transportation Needs  . Lack of Transportation (Medical): No  . Lack of  Transportation (Non-Medical): No  Physical Activity: Insufficiently Active  . Days of Exercise per Week: 4 days  . Minutes of Exercise per Session: 20 min  Stress: No Stress Concern Present  . Feeling of Stress : Not at all  Social Connections: Moderately Isolated  . Frequency of Communication with Friends and Family: More than three times a week  . Frequency of Social Gatherings with Friends and Family: More than three times a week  . Attends Religious Services: Never  . Active Member of Clubs or Organizations: No  . Attends Banker Meetings: Never  . Marital Status: Married    Review of Systems: Gen: See HPI CV: Denies chest pain or palpitations Resp: Denies dyspnea or cough.  GI: See HPI Heme:  See HPI  Physical Exam: BP 103/62   Pulse 61   Temp (!) 97.1 F (36.2 C) (Oral)   Ht 5\' 5"  (1.651 m)   Wt 164 lb (74.4 kg)   BMI 27.29 kg/m  General:   Alert and oriented. No distress noted. Pleasant and cooperative.  Head:  Normocephalic and atraumatic. Eyes:  Conjuctiva clear without scleral icterus. Heart:  S1, S2 present without murmurs appreciated. Lungs:  Clear to auscultation bilaterally. No wheezes, rales, or rhonchi. No distress.  Abdomen:  +BS, soft, non-tender and non-distended. No rebound or guarding. No HSM or masses noted. Msk:  Symmetrical without gross deformities. Normal posture. Extremities:  Without edema. Neurologic:  Alert and  oriented x4 Psych:  Normal mood and affect.

## 2020-01-09 ENCOUNTER — Ambulatory Visit (INDEPENDENT_AMBULATORY_CARE_PROVIDER_SITE_OTHER): Payer: Medicare Other | Admitting: Gastroenterology

## 2020-01-09 ENCOUNTER — Other Ambulatory Visit: Payer: Self-pay

## 2020-01-09 ENCOUNTER — Encounter: Payer: Self-pay | Admitting: Gastroenterology

## 2020-01-09 VITALS — BP 103/62 | HR 61 | Temp 97.1°F | Ht 65.0 in | Wt 164.0 lb

## 2020-01-09 DIAGNOSIS — R197 Diarrhea, unspecified: Secondary | ICD-10-CM | POA: Diagnosis not present

## 2020-01-09 DIAGNOSIS — R634 Abnormal weight loss: Secondary | ICD-10-CM | POA: Diagnosis not present

## 2020-01-09 NOTE — Assessment & Plan Note (Addendum)
74 year old female who has experienced change in bowel habits.  She has history of chronic constipation.  She reported some alternating constipation and diarrhea at her last office visit in July 2021.  CT A/P with moderate stool burden throughout the colon and gas within the vagina with indistinct planes between the vagina and rectum with inability to exclude fistula.  Suspected underlying constipation with overflow diarrhea. She did see OB/GYN who completed a pelvic and rectal exam and reported everything was normal, per patient. For the last month, she has been experiencing diarrhea with 4-5 large watery BMs daily.  Rare nocturnal stools.  No brbpr or melena. Occasional abdominal pain but nothing routinely. No recent antibiotics. No well water. No sick contacts. No travel.  Notably, she has also had 16 pound unintentional weight loss in the last 6 months. Colonoscopy up-to-date in 2017 with internal hemorrhoids and colonic diverticulosis, recommended repeat in 10 years.   Of note, she also reports to me that she recently had labs completed with cardiologist and was told she was anemic.   Differentials include infectious diarrhea, microscopic colitis, less likely celiac disease or thyroid abnormalities. Can not rule out malignancy.   Plan:  CBC, CMP, IgA, TTG IgA, TSH, C. difficile GDH and toxin A/B, GI pathogen panel. Follow a low fat diet. Avoid fried, fatty, greasy foods.  Avoid dairy products for now.  Drink enough fluids to keep urine pale yellow to clear.  Recommended drinking fluids with electrolytes such as Pedialyte or Gatorade.  Further recommendations to follow.  If labs and stool studies are unrevealing, will need to proceed with colonoscopy for further evaluation.  May also need to proceed with colonoscopy regardless due to weight loss and ?anemia.

## 2020-01-09 NOTE — Patient Instructions (Addendum)
Please have labs and stool studies completed.   Follow a low fat diet. Avoid fried, fatty, greasy foods.   Avoid dairy products for now.   Be sure you are drinking plenty of fluids. You should drink enough to keep your urine pale yellow to clear. Try drinking fluids with electrolytes in them such as Pedialyte or Gatorade.   We will call you with further recommendations after your labs are completed.   Ermalinda Memos, PA-C [facility]

## 2020-01-09 NOTE — Assessment & Plan Note (Signed)
Addressed under diarrhea 

## 2020-01-11 LAB — CBC WITH DIFFERENTIAL/PLATELET
Absolute Monocytes: 798 cells/uL (ref 200–950)
Basophils Absolute: 111 cells/uL (ref 0–200)
Basophils Relative: 1.4 %
Eosinophils Absolute: 253 cells/uL (ref 15–500)
Eosinophils Relative: 3.2 %
HCT: 36.8 % (ref 35.0–45.0)
Hemoglobin: 11.7 g/dL (ref 11.7–15.5)
Lymphs Abs: 2631 cells/uL (ref 850–3900)
MCH: 27.7 pg (ref 27.0–33.0)
MCHC: 31.8 g/dL — ABNORMAL LOW (ref 32.0–36.0)
MCV: 87.2 fL (ref 80.0–100.0)
MPV: 11.5 fL (ref 7.5–12.5)
Monocytes Relative: 10.1 %
Neutro Abs: 4108 cells/uL (ref 1500–7800)
Neutrophils Relative %: 52 %
Platelets: 289 10*3/uL (ref 140–400)
RBC: 4.22 10*6/uL (ref 3.80–5.10)
RDW: 13.9 % (ref 11.0–15.0)
Total Lymphocyte: 33.3 %
WBC: 7.9 10*3/uL (ref 3.8–10.8)

## 2020-01-11 LAB — COMPLETE METABOLIC PANEL WITH GFR
AG Ratio: 1.4 (calc) (ref 1.0–2.5)
ALT: 15 U/L (ref 6–29)
AST: 16 U/L (ref 10–35)
Albumin: 3.6 g/dL (ref 3.6–5.1)
Alkaline phosphatase (APISO): 28 U/L — ABNORMAL LOW (ref 37–153)
BUN/Creatinine Ratio: 34 (calc) — ABNORMAL HIGH (ref 6–22)
BUN: 40 mg/dL — ABNORMAL HIGH (ref 7–25)
CO2: 24 mmol/L (ref 20–32)
Calcium: 10.1 mg/dL (ref 8.6–10.4)
Chloride: 110 mmol/L (ref 98–110)
Creat: 1.17 mg/dL — ABNORMAL HIGH (ref 0.60–0.93)
GFR, Est African American: 54 mL/min/{1.73_m2} — ABNORMAL LOW (ref >=60)
GFR, Est Non African American: 47 mL/min/{1.73_m2} — ABNORMAL LOW (ref >=60)
Globulin: 2.6 g/dL (calc) (ref 1.9–3.7)
Glucose, Bld: 86 mg/dL (ref 65–99)
Potassium: 4 mmol/L (ref 3.5–5.3)
Sodium: 143 mmol/L (ref 135–146)
Total Bilirubin: 0.4 mg/dL (ref 0.2–1.2)
Total Protein: 6.2 g/dL (ref 6.1–8.1)

## 2020-01-11 LAB — TSH: TSH: 2.12 mIU/L (ref 0.40–4.50)

## 2020-01-11 LAB — TISSUE TRANSGLUTAMINASE, IGA: (tTG) Ab, IgA: 1 U/mL

## 2020-01-11 LAB — IGA: Immunoglobulin A: 126 mg/dL (ref 70–320)

## 2020-01-11 NOTE — Progress Notes (Signed)
Hemoglobin has been slowly trending down and is on the very low end of normal at 11.7.  Kidney function is slightly worse suggesting dehydration which is likely secondary to diarrhea.  No evidence of celiac disease.  TSH within normal limits.  Recommendations: 1.  It is imperative that she focus on increasing her fluid intake to prevent any worsening kidney function.  She needs to drink enough fluids to keep your urine pale yellow to clear. 2.  Due to declining hemoglobin, I would like to check an iron panel with ferritin.  Please arrange.3.  We are waiting on stool study results.  Please remind her to turn these in if she has not.  We will call her once they result.

## 2020-01-12 ENCOUNTER — Other Ambulatory Visit: Payer: Self-pay | Admitting: *Deleted

## 2020-01-12 DIAGNOSIS — R1310 Dysphagia, unspecified: Secondary | ICD-10-CM

## 2020-01-12 DIAGNOSIS — K5900 Constipation, unspecified: Secondary | ICD-10-CM

## 2020-01-12 DIAGNOSIS — K219 Gastro-esophageal reflux disease without esophagitis: Secondary | ICD-10-CM

## 2020-01-12 DIAGNOSIS — R6881 Early satiety: Secondary | ICD-10-CM

## 2020-01-12 DIAGNOSIS — R197 Diarrhea, unspecified: Secondary | ICD-10-CM

## 2020-01-12 DIAGNOSIS — R112 Nausea with vomiting, unspecified: Secondary | ICD-10-CM

## 2020-01-12 DIAGNOSIS — R198 Other specified symptoms and signs involving the digestive system and abdomen: Secondary | ICD-10-CM

## 2020-01-12 DIAGNOSIS — R634 Abnormal weight loss: Secondary | ICD-10-CM

## 2020-01-13 ENCOUNTER — Other Ambulatory Visit: Payer: Self-pay | Admitting: *Deleted

## 2020-01-13 DIAGNOSIS — K219 Gastro-esophageal reflux disease without esophagitis: Secondary | ICD-10-CM

## 2020-01-13 DIAGNOSIS — R198 Other specified symptoms and signs involving the digestive system and abdomen: Secondary | ICD-10-CM

## 2020-01-13 DIAGNOSIS — K5900 Constipation, unspecified: Secondary | ICD-10-CM

## 2020-01-13 DIAGNOSIS — R112 Nausea with vomiting, unspecified: Secondary | ICD-10-CM

## 2020-01-13 DIAGNOSIS — R634 Abnormal weight loss: Secondary | ICD-10-CM

## 2020-01-13 DIAGNOSIS — R6881 Early satiety: Secondary | ICD-10-CM

## 2020-01-13 DIAGNOSIS — R197 Diarrhea, unspecified: Secondary | ICD-10-CM

## 2020-01-13 DIAGNOSIS — R1310 Dysphagia, unspecified: Secondary | ICD-10-CM

## 2020-01-14 LAB — IRON,TIBC AND FERRITIN PANEL
%SAT: 7 % (calc) — ABNORMAL LOW (ref 16–45)
Ferritin: 192 ng/mL (ref 16–288)
Iron: 27 ug/dL — ABNORMAL LOW (ref 45–160)
TIBC: 380 mcg/dL (calc) (ref 250–450)

## 2020-01-16 LAB — GASTROINTESTINAL PATHOGEN PANEL PCR
C. difficile Tox A/B, PCR: NOT DETECTED
Campylobacter, PCR: NOT DETECTED
Cryptosporidium, PCR: NOT DETECTED
E coli (ETEC) LT/ST PCR: NOT DETECTED
E coli (STEC) stx1/stx2, PCR: NOT DETECTED
E coli 0157, PCR: NOT DETECTED
Giardia lamblia, PCR: NOT DETECTED
Norovirus, PCR: NOT DETECTED
Rotavirus A, PCR: NOT DETECTED
Salmonella, PCR: NOT DETECTED
Shigella, PCR: NOT DETECTED

## 2020-01-16 LAB — C. DIFFICILE GDH AND TOXIN A/B
GDH ANTIGEN: DETECTED
MICRO NUMBER:: 10972164
SPECIMEN QUALITY:: ADEQUATE
TOXIN A AND B: NOT DETECTED

## 2020-01-16 LAB — CLOSTRIDIUM DIFFICILE TOXIN B, QUALITATIVE, REAL-TIME PCR: Toxigenic C. Difficile by PCR: NOT DETECTED

## 2020-01-18 NOTE — Progress Notes (Signed)
PCR testing for C. Diff was negative. GI pathogen panel was also negative.   Recommendations:1.  For symptom management she may use imodium as needed. She may start with 1 or 2 imodium each morning and then 1 after each loose stool as needed with max of 4 imodium in 24 hours. Hold in the setting of constipation.  2. We need to get her scheduled for a colonoscopy to evaluate her diarrhea.  3. I would like for her to call with a progress report in 1 week to let me know how the imodium is working for her.   [facility] Clinical Pool: Please arrange colonoscopy with propofol with Dr. Jena Gauss. Dx: Diarrhea, weight loss. Please place on cancellation list.  ASA III  Medication adjustments:  Hold iron x 7 days prior to procedure.  No imodium within 3 days of her colonoscopy.

## 2020-01-21 ENCOUNTER — Encounter: Payer: Self-pay | Admitting: *Deleted

## 2020-01-28 ENCOUNTER — Telehealth: Payer: Self-pay | Admitting: Internal Medicine

## 2020-01-28 NOTE — Telephone Encounter (Signed)
That is great! She can continue Imodium for now.  We will proceed with colonoscopy as planned.  She should remember not to take Imodium for 3 days prior to her colonoscopy.

## 2020-01-28 NOTE — Telephone Encounter (Signed)
PATIENT CALLED WITH AN UPDATE AND SAID THAT THE IMMODIUM DID WORK

## 2020-01-28 NOTE — Telephone Encounter (Signed)
Noted. Pt is aware of recommendations per Ermalinda Memos, PA.

## 2020-01-28 NOTE — Telephone Encounter (Signed)
Routing to [employer], [place] as an [place].

## 2020-02-09 ENCOUNTER — Telehealth: Payer: Self-pay | Admitting: Internal Medicine

## 2020-02-09 NOTE — Telephone Encounter (Signed)
Pt said we told her she was dehydrated and to drink more fluid. She said that her feet and legs were swollen. I asked her if she had contacted her PCP and she said, no. She wanted to speak to a nurse here since we seen her last and scheduled her procedure. [phone]

## 2020-02-09 NOTE — Telephone Encounter (Signed)
Spoke with patient. LE edema is a new symptom. She has never had this before. LE edema is below the knees. States when pressing on her legs, there is no indention.  No SOB. No CP. No heart palpitations. Had previously recommended drinking enough to keep urine pale yellow to clear in the setting of diarrhea. Doubt this is the cause. She will need to be worked up further. She will call PCP first thing tomorrow or proceed to the ED with any worsening symptoms.

## 2020-02-09 NOTE — Telephone Encounter (Signed)
Spoke with pt. Pt has recently completed stool testing 01/12/20 and asked to start Imodium. Pt says her legs and feet are very swollen x 5 days. Pt hasn't contacted her PCP. Pt says she continues to drink more fluids daily so she doesn't get dehydrated. Pt is aware that she will need evaluation for her legs and feet. Pt states she isn't able to eat much and her legs continue to swell. Pt may need to go to the ED, since she hasn't contacted her PCP. Please advise.

## 2020-02-26 ENCOUNTER — Inpatient Hospital Stay (HOSPITAL_COMMUNITY)
Admission: EM | Admit: 2020-02-26 | Discharge: 2020-02-28 | DRG: 641 | Disposition: A | Discharge status: Home / Self Care | Payer: Medicare Other | Attending: Internal Medicine | Admitting: Internal Medicine

## 2020-02-26 ENCOUNTER — Emergency Department (HOSPITAL_COMMUNITY): Payer: Medicare Other

## 2020-02-26 ENCOUNTER — Encounter (HOSPITAL_COMMUNITY): Payer: Self-pay | Admitting: *Deleted

## 2020-02-26 ENCOUNTER — Other Ambulatory Visit: Payer: Self-pay

## 2020-02-26 DIAGNOSIS — Z880 Allergy status to penicillin: Secondary | ICD-10-CM

## 2020-02-26 DIAGNOSIS — E876 Hypokalemia: Secondary | ICD-10-CM | POA: Diagnosis present

## 2020-02-26 DIAGNOSIS — Z885 Allergy status to narcotic agent status: Secondary | ICD-10-CM

## 2020-02-26 DIAGNOSIS — I252 Old myocardial infarction: Secondary | ICD-10-CM

## 2020-02-26 DIAGNOSIS — I10 Essential (primary) hypertension: Secondary | ICD-10-CM | POA: Diagnosis present

## 2020-02-26 DIAGNOSIS — K5733 Diverticulitis of large intestine without perforation or abscess with bleeding: Secondary | ICD-10-CM | POA: Diagnosis present

## 2020-02-26 DIAGNOSIS — E860 Dehydration: Principal | ICD-10-CM | POA: Diagnosis present

## 2020-02-26 DIAGNOSIS — K5730 Diverticulosis of large intestine without perforation or abscess without bleeding: Secondary | ICD-10-CM | POA: Diagnosis present

## 2020-02-26 DIAGNOSIS — I9589 Other hypotension: Secondary | ICD-10-CM | POA: Diagnosis not present

## 2020-02-26 DIAGNOSIS — R600 Localized edema: Secondary | ICD-10-CM | POA: Diagnosis present

## 2020-02-26 DIAGNOSIS — I959 Hypotension, unspecified: Secondary | ICD-10-CM | POA: Diagnosis not present

## 2020-02-26 DIAGNOSIS — E46 Unspecified protein-calorie malnutrition: Secondary | ICD-10-CM | POA: Diagnosis present

## 2020-02-26 DIAGNOSIS — F419 Anxiety disorder, unspecified: Secondary | ICD-10-CM | POA: Diagnosis present

## 2020-02-26 DIAGNOSIS — D649 Anemia, unspecified: Secondary | ICD-10-CM | POA: Diagnosis not present

## 2020-02-26 DIAGNOSIS — E861 Hypovolemia: Secondary | ICD-10-CM | POA: Diagnosis not present

## 2020-02-26 DIAGNOSIS — T501X5A Adverse effect of loop [high-ceiling] diuretics, initial encounter: Secondary | ICD-10-CM | POA: Diagnosis present

## 2020-02-26 DIAGNOSIS — Z888 Allergy status to other drugs, medicaments and biological substances status: Secondary | ICD-10-CM

## 2020-02-26 DIAGNOSIS — E611 Iron deficiency: Secondary | ICD-10-CM | POA: Diagnosis present

## 2020-02-26 DIAGNOSIS — K529 Noninfective gastroenteritis and colitis, unspecified: Secondary | ICD-10-CM | POA: Diagnosis not present

## 2020-02-26 DIAGNOSIS — Z6826 Body mass index (BMI) 26.0-26.9, adult: Secondary | ICD-10-CM

## 2020-02-26 DIAGNOSIS — K219 Gastro-esophageal reflux disease without esophagitis: Secondary | ICD-10-CM | POA: Diagnosis present

## 2020-02-26 DIAGNOSIS — Z20822 Contact with and (suspected) exposure to covid-19: Secondary | ICD-10-CM | POA: Diagnosis present

## 2020-02-26 DIAGNOSIS — Z7982 Long term (current) use of aspirin: Secondary | ICD-10-CM

## 2020-02-26 DIAGNOSIS — N179 Acute kidney failure, unspecified: Secondary | ICD-10-CM | POA: Diagnosis present

## 2020-02-26 DIAGNOSIS — N3010 Interstitial cystitis (chronic) without hematuria: Secondary | ICD-10-CM | POA: Diagnosis present

## 2020-02-26 LAB — COMPREHENSIVE METABOLIC PANEL
ALT: 12 U/L (ref 0–44)
AST: 18 U/L (ref 15–41)
Albumin: 2.5 g/dL — ABNORMAL LOW (ref 3.5–5.0)
Alkaline Phosphatase: 19 U/L — ABNORMAL LOW (ref 38–126)
Anion gap: 9 (ref 5–15)
BUN: 27 mg/dL — ABNORMAL HIGH (ref 8–23)
CO2: 26 mmol/L (ref 22–32)
Calcium: 8.7 mg/dL — ABNORMAL LOW (ref 8.9–10.3)
Chloride: 105 mmol/L (ref 98–111)
Creatinine, Ser: 1.37 mg/dL — ABNORMAL HIGH (ref 0.44–1.00)
GFR, Estimated: 41 mL/min — ABNORMAL LOW (ref >60)
Glucose, Bld: 112 mg/dL — ABNORMAL HIGH (ref 70–99)
Potassium: 3.2 mmol/L — ABNORMAL LOW (ref 3.5–5.1)
Sodium: 140 mmol/L (ref 135–145)
Total Bilirubin: 0.5 mg/dL (ref 0.3–1.2)
Total Protein: 5.3 g/dL — ABNORMAL LOW (ref 6.5–8.1)

## 2020-02-26 LAB — RESPIRATORY PANEL BY RT PCR (FLU A&B, COVID)
Influenza A by PCR: NEGATIVE
Influenza B by PCR: NEGATIVE
SARS Coronavirus 2 by RT PCR: NEGATIVE

## 2020-02-26 LAB — URINALYSIS, ROUTINE W REFLEX MICROSCOPIC
Bilirubin Urine: NEGATIVE
Glucose, UA: NEGATIVE mg/dL
Hgb urine dipstick: NEGATIVE
Ketones, ur: NEGATIVE mg/dL
Leukocytes,Ua: NEGATIVE
Nitrite: NEGATIVE
Protein, ur: NEGATIVE mg/dL
Specific Gravity, Urine: 1.009 (ref 1.005–1.030)
pH: 6 (ref 5.0–8.0)

## 2020-02-26 LAB — CBC WITH DIFFERENTIAL/PLATELET
Abs Immature Granulocytes: 0.04 10*3/uL (ref 0.00–0.07)
Basophils Absolute: 0.1 10*3/uL (ref 0.0–0.1)
Basophils Relative: 1 %
Eosinophils Absolute: 0.4 10*3/uL (ref 0.0–0.5)
Eosinophils Relative: 6 %
HCT: 31.1 % — ABNORMAL LOW (ref 36.0–46.0)
Hemoglobin: 9.4 g/dL — ABNORMAL LOW (ref 12.0–15.0)
Immature Granulocytes: 1 %
Lymphocytes Relative: 41 %
Lymphs Abs: 3.1 10*3/uL (ref 0.7–4.0)
MCH: 25.5 pg — ABNORMAL LOW (ref 26.0–34.0)
MCHC: 30.2 g/dL (ref 30.0–36.0)
MCV: 84.3 fL (ref 80.0–100.0)
Monocytes Absolute: 0.6 10*3/uL (ref 0.1–1.0)
Monocytes Relative: 8 %
Neutro Abs: 3.3 10*3/uL (ref 1.7–7.7)
Neutrophils Relative %: 43 %
Platelets: 337 10*3/uL (ref 150–400)
RBC: 3.69 MIL/uL — ABNORMAL LOW (ref 3.87–5.11)
RDW: 16.9 % — ABNORMAL HIGH (ref 11.5–15.5)
WBC: 7.5 10*3/uL (ref 4.0–10.5)
nRBC: 0 % (ref 0.0–0.2)

## 2020-02-26 LAB — MAGNESIUM: Magnesium: 1.7 mg/dL (ref 1.7–2.4)

## 2020-02-26 LAB — POC OCCULT BLOOD, ED: Fecal Occult Bld: POSITIVE — AB

## 2020-02-26 MED ORDER — HYDROXYZINE HCL 25 MG PO TABS
25.0000 mg | ORAL_TABLET | Freq: Every day | ORAL | Status: DC
  Administered 2020-02-27 – 2020-02-28 (×2): 25 mg via ORAL
  Filled 2020-02-26 (×2): qty 1

## 2020-02-26 MED ORDER — POLYETHYLENE GLYCOL 3350 17 G PO PACK: 17.0000 g | PACK | Freq: Every day | ORAL | Status: DC | PRN

## 2020-02-26 MED ORDER — SODIUM CHLORIDE 0.9 % IV BOLUS
1000.0000 mL | Freq: Once | INTRAVENOUS | Status: AC
  Administered 2020-02-26: 1000 mL via INTRAVENOUS

## 2020-02-26 MED ORDER — SERTRALINE HCL 50 MG PO TABS
100.0000 mg | ORAL_TABLET | Freq: Every day | ORAL | Status: DC
  Administered 2020-02-27 – 2020-02-28 (×2): 100 mg via ORAL
  Filled 2020-02-26 (×2): qty 2

## 2020-02-26 MED ORDER — MIRABEGRON ER 25 MG PO TB24
25.0000 mg | ORAL_TABLET | Freq: Every day | ORAL | Status: DC
  Filled 2020-02-26 (×5): qty 1

## 2020-02-26 MED ORDER — IOHEXOL 300 MG/ML  SOLN
80.0000 mL | Freq: Once | INTRAMUSCULAR | Status: AC | PRN
  Administered 2020-02-26: 80 mL via INTRAVENOUS

## 2020-02-26 MED ORDER — POTASSIUM CHLORIDE IN NACL 40-0.9 MEQ/L-% IV SOLN
INTRAVENOUS | Status: AC
  Administered 2020-02-27: 100 mL/h via INTRAVENOUS
  Filled 2020-02-26 (×4): qty 1000

## 2020-02-26 MED ORDER — LEVOTHYROXINE SODIUM 25 MCG PO TABS
50.0000 ug | ORAL_TABLET | Freq: Every day | ORAL | Status: DC
  Administered 2020-02-27 – 2020-02-28 (×2): 50 ug via ORAL
  Filled 2020-02-26: qty 2
  Filled 2020-02-26: qty 1

## 2020-02-26 MED ORDER — ONDANSETRON HCL 4 MG PO TABS: 4.0000 mg | ORAL_TABLET | Freq: Four times a day (QID) | ORAL | Status: DC | PRN

## 2020-02-26 MED ORDER — ONDANSETRON HCL 4 MG/2ML IJ SOLN: 4.0000 mg | Freq: Four times a day (QID) | INTRAMUSCULAR | Status: DC | PRN

## 2020-02-26 MED ORDER — POTASSIUM CHLORIDE CRYS ER 20 MEQ PO TBCR
40.0000 meq | EXTENDED_RELEASE_TABLET | Freq: Once | ORAL | Status: AC
  Administered 2020-02-26: 40 meq via ORAL
  Filled 2020-02-26: qty 2

## 2020-02-26 MED ORDER — ACETAMINOPHEN 325 MG PO TABS: 650.0000 mg | ORAL_TABLET | Freq: Four times a day (QID) | ORAL | Status: DC | PRN

## 2020-02-26 MED ORDER — ACETAMINOPHEN 650 MG RE SUPP: 650.0000 mg | Freq: Four times a day (QID) | RECTAL | Status: DC | PRN

## 2020-02-26 NOTE — ED Provider Notes (Signed)
[facility] EMERGENCY DEPARTMENT Provider Note   CSN: [id number] Arrival date & time: 02/26/20  1325     History Chief Complaint  Patient presents with  . Hypotension    Charlotte Woods is a 74 y.o. female with past medical history of anxiety, GERD, hypertension that presents emergency department today for hypotension. Patient states that she was at her PCPs office this morning in regards to evaluation of her ongoing diarrhea and her blood pressure was 80/40 and her heart rate was 120, therefore was sent to the emergency department. Patient states that she has had ongoing diarrhea for the past couple of months, also states that she has been feeling dizzy and lightheaded for the past couple of months. States that she felt like she was almost going to pass out while she was here in the waiting room today. Also admits to vomiting this morning, states that she knew she had to go to her PCPs office therefore she took some Pepcid on an empty stomach and vomited it up. States that she does not feel nauseous, no more episodes of vomiting after that. No mucus or blood in her diarrhea. No melena. States they are unsure why she is having diarrhea, is getting colonoscopy in December. Denies any abdominal pain, chest pain, shortness of breath, bright red blood per rectum, melena. Denies any falls, syncope, headache.  States that she was also put on Lasix a couple days ago due to her feet swelling which have been helping. Is getting worked up for this as well. Denies any cough, fevers, chills, myalgias. Denies any history of heart failure or lung disease. States that she has had a decreased appetite for the past couple of months, was able to eat something small yesterday. Denies any symptoms currently.  HPI     Past Medical History:  Diagnosis Date  . Anxiety   . Arthritis   . GERD (gastroesophageal reflux disease)   . Heart attack [facility]) 2010   [facility] [address], silent  . HTN (hypertension)   .  Interstitial cystitis   . PONV (postoperative nausea and vomiting)     Patient Active Problem List   Diagnosis Date Noted  . Diarrhea 11/03/2019  . Nausea with vomiting 11/03/2019  . GERD (gastroesophageal reflux disease) 06/25/2019  . Dysphagia 06/25/2019  . Early satiety 06/25/2019  . Loss of weight 06/25/2019  . Constipation 06/25/2019  . Diverticulosis of colon without hemorrhage   . Hemorrhoid   . Rectal bleeding 04/29/2015    Past Surgical History:  Procedure Laterality Date  . APPENDECTOMY    . carpel tunnel    . CHOLECYSTECTOMY    . COLONOSCOPY  10/2013   Dr. Aleene Davidson: sigmoid diverticulosis  . COLONOSCOPY N/A 05/19/2015   Procedure: COLONOSCOPY;  Surgeon: Gerrit Friends Rourk, internal hemorrhoids, colonic diverticulosis.  Suspected patient experienced hemorrhoidal or diverticular bleed.  Recommended repeat colonoscopy in 10 years.  . ESOPHAGOGASTRODUODENOSCOPY N/A 05/19/2015   Procedure: ESOPHAGOGASTRODUODENOSCOPY (EGD);  Surgeon: Corbin Ade, MD; normal exam.  . ESOPHAGOGASTRODUODENOSCOPY (EGD) WITH PROPOFOL N/A 08/25/2019   Procedure: ESOPHAGOGASTRODUODENOSCOPY (EGD) WITH PROPOFOL;  Surgeon: Corbin Ade, MD;  Normal examined esophagus s/p dilation, normal stomach, normal examined duodenum.  . hysterectomy  2009   complete. endometrial cancer  . MALONEY DILATION N/A 08/25/2019   Procedure: Elease Hashimoto DILATION;  Surgeon: Corbin Ade, MD;  Location: [facility] END[facility]ITE;  Service: Endoscopy;  Laterality: N/A;  . TONSILLECTOMY       OB History    Gravida  2   Para      Term      Preterm      AB      Living  2     SAB      TAB      Ectopic      Multiple      Live Births  2           Family History  Problem Relation Age of Onset  . Endometrial cancer Mother        died old age, 81  . Lung cancer Father   . Colon cancer Neg Hx   . Stomach cancer Neg Hx   . Pancreatic cancer Neg Hx   . Inflammatory bowel disease Neg Hx     Social History    Tobacco Use  . Smoking status: Never Smoker  . Smokeless tobacco: Never Used  Vaping Use  . Vaping Use: Never used  Substance Use Topics  . Alcohol use: No    Alcohol/week: 0.0 standard drinks  . Drug use: No    Home Medications Prior to Admission medications   Medication Sig Start Date End Date Taking? Authorizing Provider  aspirin 81 MG tablet Take 81 mg by mouth daily.   Yes [provider]  baclofen (LIORESAL) 10 MG tablet Take 10 mg by mouth 2 (two) times daily as needed.    Yes [provider]  bismuth subsalicylate (PEPTO BISMOL) 262 MG/15ML suspension Take 30 mLs by mouth every 6 (six) hours as needed.   Yes [provider]  Cyanocobalamin (B-12 PO) Take 1 tablet by mouth daily.    Yes [provider]  diclofenac (VOLTAREN) 75 MG EC tablet Take 75 mg by mouth 2 (two) times daily. 11/02/17  Yes [provider]  ELMIRON 100 MG capsule Take 100 mg by mouth 3 (three) times daily. Reported on 05/12/2015 03/25/15  Yes [provider]  furosemide (LASIX) 20 MG tablet Take 20 mg by mouth daily. 02/19/20  Yes [provider]  GARLIC PO Take 1 tablet by mouth daily.    Yes [provider]  Ginger, Zingiber officinalis, (GINGER ROOT) 550 MG CAPS Take 1 capsule by mouth daily.    Yes [provider]  Glucosamine 500 MG CAPS Take 1,000 mg by mouth in the morning and at bedtime.    Yes [provider]  hydrOXYzine (ATARAX/VISTARIL) 25 MG tablet Take 25 mg by mouth daily.  02/03/15  Yes [provider]  levothyroxine (SYNTHROID, LEVOTHROID) 50 MCG tablet Take 1 tablet by mouth daily. 11/06/17  Yes [provider]  lisinopril-hydrochlorothiazide (ZESTORETIC) 20-12.5 MG tablet Take 2 tablets by mouth daily.  04/15/15  Yes [provider]  Multiple Vitamin (MULTIVITAMIN) tablet Take 1 tablet by mouth every other day.   Yes [provider]  MYRBETRIQ 25 MG TB24 tablet Take 25  mg by mouth daily. 11/10/17  Yes [provider]  Omega-3 Fatty Acids (FISH OIL) 1200 MG CAPS Take 1,200 mg by mouth daily.    Yes [provider]  omeprazole (PRILOSEC) 20 MG capsule Take 1 capsule (20 mg total) by mouth daily. 11/03/19  Yes Ermalinda Memos S, PA-C  ondansetron (ZOFRAN) 4 MG tablet Take 1 tablet (4 mg total) by mouth every 8 (eight) hours as needed for nausea or vomiting. 11/03/19  Yes Letta Median, PA-C  sertraline (ZOLOFT) 50 MG tablet Take 2 tablets by mouth daily.  09/18/17  Yes [provider]  Allergies    Codeine, Penicillins, Phenazopyridine, Pravastatin, Septra [sulfamethoxazole-trimethoprim], Simvastatin, and Zetia [ezetimibe]  Review of Systems   Review of Systems  Constitutional: Negative for chills, diaphoresis, fatigue and fever.  HENT: Negative for congestion, sore throat and trouble swallowing.   Eyes: Negative for pain and visual disturbance.  Respiratory: Negative for cough, shortness of breath and wheezing.   Cardiovascular: Negative for chest pain, palpitations and leg swelling.  Gastrointestinal: Positive for diarrhea. Negative for abdominal distention, abdominal pain, nausea and vomiting.  Genitourinary: Negative for difficulty urinating.  Musculoskeletal: Negative for back pain, neck pain and neck stiffness.  Skin: Negative for pallor.  Neurological: Positive for dizziness and light-headedness. Negative for speech difficulty, weakness and headaches.  Psychiatric/Behavioral: Negative for confusion.    Physical Exam Updated Vital Signs BP (!) 107/49 (BP Location: Left Arm)   Pulse 63   Temp 97.9 F (36.6 C) (Oral)   Resp 14   Ht 5' 4.5" (1.638 m)   Wt 70.8 kg   SpO2 99%   BMI 26.36 kg/m   Physical Exam Constitutional:      General: She is not in acute distress.    Appearance: Normal appearance. She is ill-appearing. She is not toxic-appearing or diaphoretic.  HENT:     Mouth/Throat:     Mouth: Mucous  membranes are moist.     Pharynx: Oropharynx is clear.  Eyes:     General: No scleral icterus.    Extraocular Movements: Extraocular movements intact.     Pupils: Pupils are equal, round, and reactive to light.  Cardiovascular:     Rate and Rhythm: Normal rate and regular rhythm.     Pulses: Normal pulses.     Heart sounds: Normal heart sounds.  Pulmonary:     Effort: Pulmonary effort is normal. No respiratory distress.     Breath sounds: Normal breath sounds. No stridor. No wheezing, rhonchi or rales.  Chest:     Chest wall: No tenderness.  Abdominal:     General: Abdomen is flat. There is no distension.     Palpations: Abdomen is soft.     Tenderness: There is no abdominal tenderness. There is no guarding or rebound.  Musculoskeletal:        General: No swelling or tenderness. Normal range of motion.     Cervical back: Normal range of motion and neck supple. No rigidity.  Skin:    General: Skin is warm and dry.     Capillary Refill: Capillary refill takes less than 2 seconds.     Coloration: Skin is not pale.  Neurological:     General: No focal deficit present.     Mental Status: She is alert and oriented to person, place, and time.     Cranial Nerves: No cranial nerve deficit.     Sensory: No sensory deficit.     Motor: No weakness.     Coordination: Coordination normal.     Gait: Gait normal.  Psychiatric:        Mood and Affect: Mood normal.        Behavior: Behavior normal.     ED Results / Procedures / Treatments   Labs (all labs ordered are listed, but only abnormal results are displayed) Labs Reviewed  COMPREHENSIVE METABOLIC PANEL - Abnormal; Notable for the following components:      Result Value   Potassium 3.2 (*)    Glucose, Bld 112 (*)    BUN 27 (*)    Creatinine, Ser 1.37 (*)  Calcium 8.7 (*)    Total Protein 5.3 (*)    Albumin 2.5 (*)    Alkaline Phosphatase 19 (*)    GFR, Estimated 41 (*)    All other components within normal limits  CBC  WITH DIFFERENTIAL/PLATELET - Abnormal; Notable for the following components:   RBC 3.69 (*)    Hemoglobin 9.4 (*)    HCT 31.1 (*)    MCH 25.5 (*)    RDW 16.9 (*)    All other components within normal limits  POC OCCULT BLOOD, ED - Abnormal; Notable for the following components:   Fecal Occult Bld POSITIVE (*)    All other components within normal limits  RESPIRATORY PANEL BY RT PCR (FLU A&B, COVID)  URINALYSIS, ROUTINE W REFLEX MICROSCOPIC    EKG None  Radiology DG Chest 2 View  Result Date: 02/26/2020 CLINICAL DATA:  Hypertension EXAM: CHEST - 2 VIEW COMPARISON:  None FINDINGS: Cardiomediastinal contours and hilar structures are normal. Lungs are clear. No sign of effusion. Limited assessment of skeletal structures are is unremarkable. IMPRESSION: No active cardiopulmonary disease. Electronically Signed   By: Donzetta Kohut M.D.   On: 02/26/2020 19:00   CT Abdomen Pelvis W Contrast  Result Date: 02/26/2020 CLINICAL DATA:  Hypotension.  Diverticulitis suspected. EXAM: CT ABDOMEN AND PELVIS WITH CONTRAST TECHNIQUE: Multidetector CT imaging of the abdomen and pelvis was performed using the standard protocol following bolus administration of intravenous contrast. CONTRAST:  80mL OMNIPAQUE IOHEXOL 300 MG/ML  SOLN COMPARISON:  11/11/2019 FINDINGS: Lower chest: No acute abnormality Hepatobiliary: Prior cholecystectomy. Mild biliary ductal dilatation is stable since prior study likely related to post cholecystectomy state. No focal hepatic abnormality. Pancreas: No focal abnormality or ductal dilatation. Spleen: No focal abnormality.  Normal size. Adrenals/Urinary Tract: No adrenal abnormality. No focal renal abnormality. No stones or hydronephrosis. Urinary bladder is unremarkable. Stomach/Bowel: Left colonic diverticulosis. No evidence of diverticulitis. Stomach and small bowel grossly unremarkable. Vascular/Lymphatic: Aortoiliac atherosclerosis. No evidence of aneurysm or adenopathy.  Reproductive: Prior hysterectomy.  No adnexal masses. Other: No free fluid or free air. Musculoskeletal: No acute bony abnormality. Diffuse degenerative changes throughout the lumbar spine. IMPRESSION: Left colonic diverticulosis.  No active diverticulitis. Aortic atherosclerosis. No acute findings. Electronically Signed   By: Charlett Nose M.D.   On: 02/26/2020 20:35    Procedures Procedures (including critical care time)  Medications Ordered in ED Medications  sodium chloride 0.9 % bolus 1,000 mL (has no administration in time range)  potassium chloride SA (KLOR-CON) CR tablet 40 mEq (has no administration in time range)  sodium chloride 0.9 % bolus 1,000 mL (0 mLs Intravenous Stopped 02/26/20 1842)  iohexol (OMNIPAQUE) 300 MG/ML solution 80 mL (80 mLs Intravenous Contrast Given 02/26/20 2018)    ED Course  I have reviewed the triage vital signs and the nursing notes.  Pertinent labs & imaging results that were available during my care of the patient were reviewed by me and considered in my medical decision making (see chart for details).    MDM Rules/Calculators/A&P                         SHYNIECE SCRIPTER is a 74 y.o. female with past medical history of anxiety, GERD, hypertension that presents emergency department today for hypotension.  Benign physical exam, normal neuro exam, nursing was able to do rectal exam and they did not notice any bright red blood or melena.  Patient does not appear to be in  acute distress, however does appeal frail and weak, pressures in the 80s.  Not tachycardic.  Differential diagnoses considered include orthostatic hypotension, shock, GI bleed.  No other sirs symptoms.  Initial interventions IV fluids.  ECG interpreted by me demonstrated no acute ischemic changes.  CXR interpreted by me demonstrated active cardiopulmonary disease.  Labs demonstrated hemoglobin of 9.4, this has been downtrending, baseline appears to be 12.  Also remarkable for dehydration  with creatinine of 1.37 which has increased and increased BUN of 2.7.  Potassium 3.2, did replete orally here today.  Fecal occult positive, patient denies any melena or hematochezia.  CT abdomen without any signs of diverticulitis, I do think that patient needs to come into the hospital at this time due to hypotension most likely from slow GI bleed.  Pressure still remained in the 80s she did have near syncope from it today, pressure did not increase much with 1 L.  We are giving a second liter at this time.  853 spoke to hospitalist who will accept the patient.  The patient appears reasonably stabilized for admission considering the current resources, flow, and capabilities available in the ED at this time, and I doubt any other [facility] requiring further screening and/or treatment in the ED prior to admission.   Final Clinical Impression(s) / ED Diagnoses Final diagnoses:  Hypotension, unspecified hypotension type    Rx / DC Orders ED Discharge Orders    None       Farrel Gordonatel, Anatasia Tino, PA-C 02/26/20 2121    Bethann BerkshireZammit, Joseph, MD 02/27/20 2229

## 2020-02-26 NOTE — H&P (Signed)
History and Physical    Charlotte Woods OMV:[id number] DOB: April 27, 1945 DOA: 02/26/2020  PCP: Altamease Oiler, FNP   Patient coming from: Home  I have personally briefly reviewed patient's old medical records in [facility]  Chief Complaint: Dizziness, low blood pressure.  HPI: Charlotte Woods is a 73 y.o. female with medical history significant for diverticulosis, hypertension, interstitial cystitis.  Patient went to see her primary care provider today and was found to be hypotensive, blood pressure 80/40, heart rate 120s so she was sent to the ED. Patient reports intermittent episodes of dizziness and lightheadedness when standing over the past month.  Patient has had diarrhea over the past 2 months.  She reports initially she was having at least 5-7 bowel movements every day, watery stools.  No blood.  She was started on loperamide and this reduced the bowel movements to at least 2 watery stools every day.  She is taking loperamide every day and has finished 4 packets she says. She had one episode of vomiting today after she took a lot of Pepto-Bismol, otherwise no episodes of vomiting, no abdominal pain. Patient reports chronic poor oral intake, and significant weight loss over the past 3 months.  Per charts patient has had 20 pound weight loss since May.  Patient also reports bilateral lower extremity swelling 2 weeks ago, she was started on Lasix 20 mg daily about a week ago, and this significantly improved her lower extremity swelling.  She denies chest pain, no difficulty breathing.  She denies black stools, no blood in stools, no vomiting of blood.  Patient followed up with her gastroenterologist for her diarrhea, she had stool work-up done, and outpatient colonoscopy planned.  ED Course: Blood pressure systolic down to 62/83, improved to 107/49.  Heart rate 60s to 70s.  O2 sats greater than 99% on room air.  Hemoglobin down to 9.5.  Potassium 3.2.  Creatinine 1.37.  UA  unremarkable.  Stool occult positive..  1.5 L bolus of fluids ordered in ED.  Hospitalist to admit for hypotension.  Review of Systems: As per HPI all other systems reviewed and negative.  Past Medical History:  Diagnosis Date  . Anxiety   . Arthritis   . GERD (gastroesophageal reflux disease)   . Heart attack [facility]) 2010   [facility], silent  . HTN (hypertension)   . Interstitial cystitis   . PONV (postoperative nausea and vomiting)     Past Surgical History:  Procedure Laterality Date  . APPENDECTOMY    . carpel tunnel    . CHOLECYSTECTOMY    . COLONOSCOPY  10/2013   Dr. Aleene Davidson: sigmoid diverticulosis  . COLONOSCOPY N/A 05/19/2015   Procedure: COLONOSCOPY;  Surgeon: Gerrit Friends Rourk, internal hemorrhoids, colonic diverticulosis.  Suspected patient experienced hemorrhoidal or diverticular bleed.  Recommended repeat colonoscopy in 10 years.  . ESOPHAGOGASTRODUODENOSCOPY N/A 05/19/2015   Procedure: ESOPHAGOGASTRODUODENOSCOPY (EGD);  Surgeon: Corbin Ade, MD; normal exam.  . ESOPHAGOGASTRODUODENOSCOPY (EGD) WITH PROPOFOL N/A 08/25/2019   Procedure: ESOPHAGOGASTRODUODENOSCOPY (EGD) WITH PROPOFOL;  Surgeon: Corbin Ade, MD;  Normal examined esophagus s/p dilation, normal stomach, normal examined duodenum.  . hysterectomy  2009   complete. endometrial cancer  . MALONEY DILATION N/A 08/25/2019   Procedure: Elease Hashimoto DILATION;  Surgeon: Corbin Ade, MD;  Location: [facility];  Service: Endoscopy;  Laterality: N/A;  . TONSILLECTOMY       reports that she has never smoked. She has never used smokeless tobacco. She reports that  she does not drink alcohol and does not use drugs.  Allergies  Allergen Reactions  . Codeine Other (See Comments)    Hallucinations   . Penicillins Hives  . Phenazopyridine Hives  . Pravastatin Other (See Comments)    Body aches   . Septra [Sulfamethoxazole-Trimethoprim] Hives  . Simvastatin Other (See Comments)    Body aches   . Zetia  [Ezetimibe] Other (See Comments)    Muscle cramps     Family History  Problem Relation Age of Onset  . Endometrial cancer Mother        died old age, 27  . Lung cancer Father   . Colon cancer Neg Hx   . Stomach cancer Neg Hx   . Pancreatic cancer Neg Hx   . Inflammatory bowel disease Neg Hx     Prior to Admission medications   Medication Sig Start Date End Date Taking? Authorizing Provider  aspirin 81 MG tablet Take 81 mg by mouth daily.   Yes [provider]  baclofen (LIORESAL) 10 MG tablet Take 10 mg by mouth 2 (two) times daily as needed.    Yes [provider]  bismuth subsalicylate (PEPTO BISMOL) 262 MG/15ML suspension Take 30 mLs by mouth every 6 (six) hours as needed.   Yes [provider]  Cyanocobalamin (B-12 PO) Take 1 tablet by mouth daily.    Yes [provider]  diclofenac (VOLTAREN) 75 MG EC tablet Take 75 mg by mouth 2 (two) times daily. 11/02/17  Yes [provider]  ELMIRON 100 MG capsule Take 100 mg by mouth 3 (three) times daily. Reported on 05/12/2015 03/25/15  Yes [provider]  furosemide (LASIX) 20 MG tablet Take 20 mg by mouth daily. 02/19/20  Yes [provider]  GARLIC PO Take 1 tablet by mouth daily.    Yes [provider]  Ginger, Zingiber officinalis, (GINGER ROOT) 550 MG CAPS Take 1 capsule by mouth daily.    Yes [provider]  Glucosamine 500 MG CAPS Take 1,000 mg by mouth in the morning and at bedtime.    Yes [provider]  hydrOXYzine (ATARAX/VISTARIL) 25 MG tablet Take 25 mg by mouth daily.  02/03/15  Yes [provider]  levothyroxine (SYNTHROID, LEVOTHROID) 50 MCG tablet Take 1 tablet by mouth daily. 11/06/17  Yes [provider]  lisinopril-hydrochlorothiazide (ZESTORETIC) 20-12.5 MG tablet Take 2 tablets by mouth daily.  04/15/15  Yes [provider]  Multiple Vitamin (MULTIVITAMIN) tablet Take 1 tablet by mouth every other day.    Yes [provider]  MYRBETRIQ 25 MG TB24 tablet Take 25 mg by mouth daily. 11/10/17  Yes [provider]  Omega-3 Fatty Acids (FISH OIL) 1200 MG CAPS Take 1,200 mg by mouth daily.    Yes [provider]  omeprazole (PRILOSEC) 20 MG capsule Take 1 capsule (20 mg total) by mouth daily. 11/03/19  Yes Ermalinda Memos S, PA-C  ondansetron (ZOFRAN) 4 MG tablet Take 1 tablet (4 mg total) by mouth every 8 (eight) hours as needed for nausea or vomiting. 11/03/19  Yes Letta Median, PA-C  sertraline (ZOLOFT) 50 MG tablet Take 2 tablets by mouth daily.  09/18/17  Yes [provider]    Physical Exam: Vitals:   02/26/20 1915 02/26/20 1930 02/26/20 2003 02/26/20 2005  BP:   (!) 107/49 92/60  Pulse:   63 67  Resp: 16 17 14  (!) 21  Temp:      TempSrc:  SpO2:   99% 99%  Weight:      Height:        Constitutional: NAD, calm, comfortable Vitals:   02/26/20 1915 02/26/20 1930 02/26/20 2003 02/26/20 2005  BP:   (!) 107/49 92/60  Pulse:   63 67  Resp: 16 17 14  (!) 21  Temp:      TempSrc:      SpO2:   99% 99%  Weight:      Height:       Eyes: PERRL, lids and conjunctivae normal ENMT: Mucous membranes are moist.  Neck: normal, supple, no masses, no thyromegaly Respiratory: clear to auscultation bilaterally, no wheezing, no crackles. Normal respiratory effort. No accessory muscle use.  Cardiovascular: Regular rate and rhythm, 2/6 systolic murmurs, no rubs / gallops.  Bilateral feet mildly puffy, but no significant pitting extremity edema  2+ pedal pulses. Abdomen: no tenderness, no masses palpated. No hepatosplenomegaly. Bowel sounds positive.  Musculoskeletal: no clubbing / cyanosis. No joint deformity upper and lower extremities. Good ROM, no contractures. Normal muscle tone.  Skin: no rashes, lesions, ulcers. No induration Neurologic: No apparent cranial abnormality, moving extremities spontaneously. Psychiatric: Normal judgment and insight. Alert and  oriented x 3. Normal mood.   Labs on Admission: I have personally reviewed following labs and imaging studies  CBC: Recent Labs  Lab 02/26/20 1901  WBC 7.5  NEUTROABS 3.3  HGB 9.4*  HCT 31.1*  MCV 84.3  PLT 337   Basic Metabolic Panel: Recent Labs  Lab 02/26/20 1901  NA 140  K 3.2*  CL 105  CO2 26  GLUCOSE 112*  BUN 27*  CREATININE 1.37*  CALCIUM 8.7*   Liver Function Tests: Recent Labs  Lab 02/26/20 1901  AST 18  ALT 12  ALKPHOS 19*  BILITOT 0.5  PROT 5.3*  ALBUMIN 2.5*   Urine analysis:    Component Value Date/Time   COLORURINE YELLOW 02/26/2020 1900   APPEARANCEUR CLEAR 02/26/2020 1900   LABSPEC 1.009 02/26/2020 1900   PHURINE 6.0 02/26/2020 1900   GLUCOSEU NEGATIVE 02/26/2020 1900   HGBUR NEGATIVE 02/26/2020 1900   BILIRUBINUR NEGATIVE 02/26/2020 1900   KETONESUR NEGATIVE 02/26/2020 1900   PROTEINUR NEGATIVE 02/26/2020 1900   NITRITE NEGATIVE 02/26/2020 1900   LEUKOCYTESUR NEGATIVE 02/26/2020 1900    Radiological Exams on Admission: DG Chest 2 View  Result Date: 02/26/2020 CLINICAL DATA:  Hypertension EXAM: CHEST - 2 VIEW COMPARISON:  None FINDINGS: Cardiomediastinal contours and hilar structures are normal. Lungs are clear. No sign of effusion. Limited assessment of skeletal structures are is unremarkable. IMPRESSION: No active cardiopulmonary disease. Electronically Signed   By: 13/07/2019 M.D.   On: 02/26/2020 19:00   CT Abdomen Pelvis W Contrast  Result Date: 02/26/2020 CLINICAL DATA:  Hypotension.  Diverticulitis suspected. EXAM: CT ABDOMEN AND PELVIS WITH CONTRAST TECHNIQUE: Multidetector CT imaging of the abdomen and pelvis was performed using the standard protocol following bolus administration of intravenous contrast. CONTRAST:  92mL OMNIPAQUE IOHEXOL 300 MG/ML  SOLN COMPARISON:  11/11/2019 FINDINGS: Lower chest: No acute abnormality Hepatobiliary: Prior cholecystectomy. Mild biliary ductal dilatation is stable since prior study likely  related to post cholecystectomy state. No focal hepatic abnormality. Pancreas: No focal abnormality or ductal dilatation. Spleen: No focal abnormality.  Normal size. Adrenals/Urinary Tract: No adrenal abnormality. No focal renal abnormality. No stones or hydronephrosis. Urinary bladder is unremarkable. Stomach/Bowel: Left colonic diverticulosis. No evidence of diverticulitis. Stomach and small bowel grossly unremarkable. Vascular/Lymphatic: Aortoiliac atherosclerosis. No evidence of aneurysm or adenopathy.  Reproductive: Prior hysterectomy.  No adnexal masses. Other: No free fluid or free air. Musculoskeletal: No acute bony abnormality. Diffuse degenerative changes throughout the lumbar spine. IMPRESSION: Left colonic diverticulosis.  No active diverticulitis. Aortic atherosclerosis. No acute findings. Electronically Signed   By: Charlett NoseKevin  Dover M.D.   On: 02/26/2020 20:35    EKG: Independently reviewed.  Sinus rhythm rate 65, QTC 471.  No significant ST or T wave changes compared to prior.  Assessment/Plan Principal Problem:   Hypotension Active Problems:   Chronic diarrhea   Acute anemia  Hypotension-blood pressure down to 80/49.  Likely combination of dehydration-diarrhea, poor oral intake, and antihypertensive and diuretic agents-lisinopril HCTZ and Lasix. -1.5 L bolus ordered, continue N/s + 40 KCL 100cc/hr x 15hrs -BMP CBC in the morning -Hold home lisinopril HCTZ, hold Lasix. -Obtain echocardiogram  Chronic diarrhea-of 2 months duration.  01/12/2020-stool C. difficile and GI pathogen panel both negative.  Also had IgA, TSH and tissue transglutaminase antibody levels checked and were normal.  Had outpatient colonoscopy planned. -Continue loperamide as needed -GI consult -Hold Elmiron adverse effects include diarrhea  Acute anemia-hemoglobin 9.5, baseline 11-12.  Stool occult positive.  At this time no melena, hematochezia or hematemesis.  She takes diclofenac 75 twice daily, and 81 mg aspirin  daily.  Last EGD 08/2019 was unremarkable.  2017 colonoscopy done for self-limiting hematochezia showed diverticulosis and internal hemorrhoids. -CBC in the morning -Obtain anemia panel  Hypertension-currently hypotensive,  - hold antihypertensives lisinopril HCTZ  Leg swelling-resolved at this time. no prior echo.  Likely due to hypoalbuminemia of 2.5 from severe diarrhea and poor oral intake. -Obtain echo - Hold Lasix.  Protein energy malnutrition, weight loss-20 pound weight loss since May.  Likely due to chronic diarrhea and poor oral intake.  Interstitial cystitis -Hold Elmiron for now, adverse effects include diarrhea.  Hypokalemia-3.2.  Likely from diarrhea.  Magnesium normal 1.7. -Replete.  DVT prophylaxis: SCDs for now, with acute anemia Code Status: Full code Family Communication: Spouse at bedside Disposition Plan:  ~ 2 days, pending GI evaluation. Consults called: GI consult Admission status: Observation, telemetry   Onnie BoerEjiroghene E Thales Knipple MD [facility]  02/26/2020, 9:49 PM

## 2020-02-26 NOTE — ED Triage Notes (Signed)
Pt was seen at PCP office this morning and her BP was 80/40, HR 120 so she was sent to ED for further evaluation. Pt reports she was recently placed on a fluid pill due to swelling in her legs.

## 2020-02-27 ENCOUNTER — Observation Stay (HOSPITAL_COMMUNITY): Payer: Medicare Other

## 2020-02-27 ENCOUNTER — Telehealth: Payer: Self-pay | Admitting: Nurse Practitioner

## 2020-02-27 DIAGNOSIS — I9589 Other hypotension: Secondary | ICD-10-CM

## 2020-02-27 DIAGNOSIS — R112 Nausea with vomiting, unspecified: Secondary | ICD-10-CM

## 2020-02-27 DIAGNOSIS — Z880 Allergy status to penicillin: Secondary | ICD-10-CM | POA: Diagnosis not present

## 2020-02-27 DIAGNOSIS — I959 Hypotension, unspecified: Secondary | ICD-10-CM

## 2020-02-27 DIAGNOSIS — I10 Essential (primary) hypertension: Secondary | ICD-10-CM | POA: Diagnosis present

## 2020-02-27 DIAGNOSIS — E876 Hypokalemia: Secondary | ICD-10-CM | POA: Diagnosis present

## 2020-02-27 DIAGNOSIS — Z888 Allergy status to other drugs, medicaments and biological substances status: Secondary | ICD-10-CM | POA: Diagnosis not present

## 2020-02-27 DIAGNOSIS — F419 Anxiety disorder, unspecified: Secondary | ICD-10-CM | POA: Diagnosis present

## 2020-02-27 DIAGNOSIS — D649 Anemia, unspecified: Secondary | ICD-10-CM | POA: Diagnosis present

## 2020-02-27 DIAGNOSIS — Z7982 Long term (current) use of aspirin: Secondary | ICD-10-CM | POA: Diagnosis not present

## 2020-02-27 DIAGNOSIS — R600 Localized edema: Secondary | ICD-10-CM | POA: Diagnosis present

## 2020-02-27 DIAGNOSIS — N179 Acute kidney failure, unspecified: Secondary | ICD-10-CM | POA: Diagnosis present

## 2020-02-27 DIAGNOSIS — K219 Gastro-esophageal reflux disease without esophagitis: Secondary | ICD-10-CM | POA: Diagnosis present

## 2020-02-27 DIAGNOSIS — E46 Unspecified protein-calorie malnutrition: Secondary | ICD-10-CM | POA: Diagnosis present

## 2020-02-27 DIAGNOSIS — N3010 Interstitial cystitis (chronic) without hematuria: Secondary | ICD-10-CM | POA: Diagnosis present

## 2020-02-27 DIAGNOSIS — Z20822 Contact with and (suspected) exposure to covid-19: Secondary | ICD-10-CM | POA: Diagnosis present

## 2020-02-27 DIAGNOSIS — K529 Noninfective gastroenteritis and colitis, unspecified: Secondary | ICD-10-CM | POA: Diagnosis present

## 2020-02-27 DIAGNOSIS — E860 Dehydration: Secondary | ICD-10-CM | POA: Diagnosis present

## 2020-02-27 DIAGNOSIS — E611 Iron deficiency: Secondary | ICD-10-CM | POA: Diagnosis present

## 2020-02-27 DIAGNOSIS — E861 Hypovolemia: Secondary | ICD-10-CM | POA: Diagnosis not present

## 2020-02-27 DIAGNOSIS — I252 Old myocardial infarction: Secondary | ICD-10-CM | POA: Diagnosis not present

## 2020-02-27 DIAGNOSIS — Z885 Allergy status to narcotic agent status: Secondary | ICD-10-CM | POA: Diagnosis not present

## 2020-02-27 DIAGNOSIS — K5733 Diverticulitis of large intestine without perforation or abscess with bleeding: Secondary | ICD-10-CM | POA: Diagnosis present

## 2020-02-27 LAB — IRON AND TIBC
Iron: 23 ug/dL — ABNORMAL LOW (ref 28–170)
Saturation Ratios: 6 % — ABNORMAL LOW (ref 10.4–31.8)
TIBC: 361 ug/dL (ref 250–450)
UIBC: 338 ug/dL

## 2020-02-27 LAB — CBC
HCT: 28.9 % — ABNORMAL LOW (ref 36.0–46.0)
Hemoglobin: 9 g/dL — ABNORMAL LOW (ref 12.0–15.0)
MCH: 26.4 pg (ref 26.0–34.0)
MCHC: 31.1 g/dL (ref 30.0–36.0)
MCV: 84.8 fL (ref 80.0–100.0)
Platelets: 306 10*3/uL (ref 150–400)
RBC: 3.41 MIL/uL — ABNORMAL LOW (ref 3.87–5.11)
RDW: 17.2 % — ABNORMAL HIGH (ref 11.5–15.5)
WBC: 6.4 10*3/uL (ref 4.0–10.5)
nRBC: 0 % (ref 0.0–0.2)

## 2020-02-27 LAB — ECHOCARDIOGRAM COMPLETE
AR max vel: 2.31 cm2
AV Area VTI: 2.23 cm2
AV Area mean vel: 2.09 cm2
AV Mean grad: 5.4 mmHg
AV Peak grad: 11 mmHg
Ao pk vel: 1.66 m/s
Area-P 1/2: 2.93 cm2
Height: 64.5 in
S' Lateral: 2.21 cm
Weight: 2496 oz

## 2020-02-27 LAB — BASIC METABOLIC PANEL
Anion gap: 8 (ref 5–15)
BUN: 21 mg/dL (ref 8–23)
CO2: 25 mmol/L (ref 22–32)
Calcium: 8.5 mg/dL — ABNORMAL LOW (ref 8.9–10.3)
Chloride: 109 mmol/L (ref 98–111)
Creatinine, Ser: 1.02 mg/dL — ABNORMAL HIGH (ref 0.44–1.00)
GFR, Estimated: 58 mL/min — ABNORMAL LOW (ref >60)
Glucose, Bld: 91 mg/dL (ref 70–99)
Potassium: 3.7 mmol/L (ref 3.5–5.1)
Sodium: 142 mmol/L (ref 135–145)

## 2020-02-27 LAB — FOLATE: Folate: 44.5 ng/mL (ref >5.9)

## 2020-02-27 LAB — FERRITIN: Ferritin: 135 ng/mL (ref 11–307)

## 2020-02-27 LAB — RETICULOCYTES
Immature Retic Fract: 26.8 % — ABNORMAL HIGH (ref 2.3–15.9)
RBC.: 3.5 MIL/uL — ABNORMAL LOW (ref 3.87–5.11)
Retic Count, Absolute: 85.4 10*3/uL (ref 19.0–186.0)
Retic Ct Pct: 2.4 % (ref 0.4–3.1)

## 2020-02-27 LAB — VITAMIN B12: Vitamin B-12: 751 pg/mL (ref 180–914)

## 2020-02-27 MED ORDER — SODIUM CHLORIDE 0.9 % IV SOLN
510.0000 mg | Freq: Once | INTRAVENOUS | Status: AC
  Administered 2020-02-27: 510 mg via INTRAVENOUS
  Filled 2020-02-27: qty 510

## 2020-02-27 MED ORDER — CHLORHEXIDINE GLUCONATE CLOTH 2 % EX PADS
6.0000 | MEDICATED_PAD | Freq: Every day | CUTANEOUS | Status: DC
  Administered 2020-02-27: 6 via TOPICAL

## 2020-02-27 MED ORDER — LOPERAMIDE HCL 2 MG PO CAPS
2.0000 mg | ORAL_CAPSULE | ORAL | Status: DC | PRN
  Administered 2020-02-28 (×2): 2 mg via ORAL
  Filled 2020-02-27 (×2): qty 1

## 2020-02-27 MED ORDER — PROMETHAZINE HCL 12.5 MG PO TABS: 12.5000 mg | ORAL_TABLET | Freq: Three times a day (TID) | ORAL | Status: DC | PRN

## 2020-02-27 MED ORDER — ONDANSETRON 4 MG PO TBDP
4.0000 mg | ORAL_TABLET | Freq: Four times a day (QID) | ORAL | Status: DC
  Administered 2020-02-27 – 2020-02-28 (×3): 4 mg via ORAL
  Filled 2020-02-27 (×3): qty 1

## 2020-02-27 NOTE — ED Notes (Signed)
Has eaten meal  Reports "delicious"  Sitting on edge of bed   NAD   continues to await bed

## 2020-02-27 NOTE — Consult Note (Signed)
Referring Provider: [facility] Primary Care Physician:  Altamease OilerHarris, Meredith L, FNP Primary Gastroenterologist:  Dr. Jena Gaussourk  Date of Admission: 02/26/20 Date of Consultation: 02/27/20  Reason for Consultation:  Diarrhea  HPI:  Charlotte Woods is a 74 y.o. female with a past medical history of GERD, MI, hypertension, anxiety, arthritis. The patient was last seen in our office 01/09/2020 for diarrhea and weight loss. Colonoscopy up-to-date 2017 with hemorrhoids and diverticulosis and recommended 10-year repeat. EGD May 2021 with normal exam status post esophageal dilation. CT 11/11/2018 with moderate stool burden throughout the colon with gas within the vagina with indistinct planes unable to exclude fistula and recommended gynecologic exam and DRE. Gastric emptying study 11/20/2019 was normal. At that time noted 4 lb weight loss, 2 meals a day. Having 4-5 large watery bowel movements a day that has become persistent over the past month, not currently taking anything. Saw OB/GYN for pelvic and rectal exams and was told no other abnormalities. No recent antibiotics, well water, sick contacts, or travel. No fever, chills, lightheadedness, dizziness. At that time recommended labs and stool studies, low-fat diet, avoid dairy, push fluids.  C. difficile PCR negative, GI pathogen panel negative. Recommended Imodium as needed, schedule colonoscopy to evaluate persistent diarrhea, progress for 1 week.  On 01/28/2019 when she called indicating Imodium was helping. Colonoscopy scheduled for 04/05/2020.  The patient presented the emergency department yesterday on 02/26/2020 with complaints of hypotension as noted in her PCP office with blood pressure of 80/40 and heart rate of 120, subsequently referred to the ED. Notes ongoing diarrhea for the past couple months, dizziness, lightheadedness. Felt near syncopal. Some vomiting the morning of her presentation. No hematochezia or melena. Recently started on Lasix for  lower extremity edema and work-up for etiology of this ongoing. No fevers or chills. Noted decreased appetite.  Labs in the ED showed hypokalemia with potassium 3.2, creatinine elevated 1.37, hemoglobin 9.4 (baseline appears to be 12), fecal occult blood positive. She was admitted for electrolyte abnormalities, dehydration. CT scan without diverticulitis, no acute findings.  Labs today show improvement in creatinine to 1.02 with fluids, hemoglobin stable at 9.0 (normal indices), B12 and folate normal, iron low at 23, iron saturation low at 6%, ferritin normal/stable at 135. Reticulocyte count normal with increased immature reticulocyte fraction.  After fluids her vital signs have stabilized. Most recently her blood pressure 120/47, heart rate 69, 98% saturation on room air.  Today she states she is feeling better overall.  Her diarrhea did improve after seen in our office with the addition of Imodium, but was still having 2-3 stools daily.  Stools are described as loose but not watery.  She thinks she was not drinking enough fluids.  Denies any overt hematochezia or melena.  No abdominal pain associated with this.  She has had some intermittent nausea.  She did have some vomiting about an hour ago.  She is already had 1 large loose stools this morning.  No other overt GI complaints.  Past Medical History:  Diagnosis Date  . Anxiety   . Arthritis   . GERD (gastroesophageal reflux disease)   . Heart attack [facility]) 2010   [facility] Dr. Scotty CourtStafford, silent  . HTN (hypertension)   . Interstitial cystitis   . PONV (postoperative nausea and vomiting)     Past Surgical History:  Procedure Laterality Date  . APPENDECTOMY    . carpel tunnel    . CHOLECYSTECTOMY    . COLONOSCOPY  10/2013   Dr. Aleene DavidsonSpainhour:  sigmoid diverticulosis  . COLONOSCOPY N/A 05/19/2015   Procedure: COLONOSCOPY;  Surgeon: Gerrit Friends Rourk, internal hemorrhoids, colonic diverticulosis.  Suspected patient experienced hemorrhoidal or  diverticular bleed.  Recommended repeat colonoscopy in 10 years.  . ESOPHAGOGASTRODUODENOSCOPY N/A 05/19/2015   Procedure: ESOPHAGOGASTRODUODENOSCOPY (EGD);  Surgeon: Corbin Ade, MD; normal exam.  . ESOPHAGOGASTRODUODENOSCOPY (EGD) WITH PROPOFOL N/A 08/25/2019   Procedure: ESOPHAGOGASTRODUODENOSCOPY (EGD) WITH PROPOFOL;  Surgeon: Corbin Ade, MD;  Normal examined esophagus s/p dilation, normal stomach, normal examined duodenum.  . hysterectomy  2009   complete. endometrial cancer  . MALONEY DILATION N/A 08/25/2019   Procedure: Elease Hashimoto DILATION;  Surgeon: Corbin Ade, MD;  Location: [facility];  Service: Endoscopy;  Laterality: N/A;  . TONSILLECTOMY      Prior to Admission medications   Medication Sig Start Date End Date Taking? Authorizing Provider  aspirin 81 MG tablet Take 81 mg by mouth daily.   Yes [provider]  baclofen (LIORESAL) 10 MG tablet Take 10 mg by mouth 2 (two) times daily as needed.    Yes [provider]  bismuth subsalicylate (PEPTO BISMOL) 262 MG/15ML suspension Take 30 mLs by mouth every 6 (six) hours as needed.   Yes [provider]  Cyanocobalamin (B-12 PO) Take 1 tablet by mouth daily.    Yes [provider]  diclofenac (VOLTAREN) 75 MG EC tablet Take 75 mg by mouth 2 (two) times daily. 11/02/17  Yes [provider]  ELMIRON 100 MG capsule Take 100 mg by mouth 3 (three) times daily. Reported on 05/12/2015 03/25/15  Yes [provider]  furosemide (LASIX) 20 MG tablet Take 20 mg by mouth daily. 02/19/20  Yes [provider]  GARLIC PO Take 1 tablet by mouth daily.    Yes [provider]  Ginger, Zingiber officinalis, (GINGER ROOT) 550 MG CAPS Take 1 capsule by mouth daily.    Yes [provider]  Glucosamine 500 MG CAPS Take 1,000 mg by mouth in the morning and at bedtime.    Yes [provider]  hydrOXYzine (ATARAX/VISTARIL) 25 MG tablet Take 25 mg by mouth daily.   02/03/15  Yes [provider]  levothyroxine (SYNTHROID, LEVOTHROID) 50 MCG tablet Take 1 tablet by mouth daily. 11/06/17  Yes [provider]  lisinopril-hydrochlorothiazide (ZESTORETIC) 20-12.5 MG tablet Take 2 tablets by mouth daily.  04/15/15  Yes [provider]  Multiple Vitamin (MULTIVITAMIN) tablet Take 1 tablet by mouth every other day.   Yes [provider]  MYRBETRIQ 25 MG TB24 tablet Take 25 mg by mouth daily. 11/10/17  Yes [provider]  Omega-3 Fatty Acids (FISH OIL) 1200 MG CAPS Take 1,200 mg by mouth daily.    Yes [provider]  omeprazole (PRILOSEC) 20 MG capsule Take 1 capsule (20 mg total) by mouth daily. 11/03/19  Yes Ermalinda Memos S, PA-C  ondansetron (ZOFRAN) 4 MG tablet Take 1 tablet (4 mg total) by mouth every 8 (eight) hours as needed for nausea or vomiting. 11/03/19  Yes Letta Median, PA-C  sertraline (ZOLOFT) 50 MG tablet Take 2 tablets by mouth daily.  09/18/17  Yes [provider]    Current Facility-Administered Medications  Medication Dose Route Frequency Provider Last Rate Last Admin  . 0.9 % NaCl with KCl 40 mEq / L  infusion   Intravenous Continuous Emokpae, Ejiroghene E, MD 100 mL/hr at 02/26/20 2344 New Bag at 02/26/20 2344  . acetaminophen (TYLENOL) tablet 650 mg  650 mg  Oral Q6H PRN Emokpae, Ejiroghene E, MD       Or  . acetaminophen (TYLENOL) suppository 650 mg  650 mg Rectal Q6H PRN Emokpae, Ejiroghene E, MD      . ferumoxytol (FERAHEME) 510 mg in sodium chloride 0.9 % 100 mL IVPB  510 mg Intravenous Once Sherryll Burger, Pratik D, DO      . hydrOXYzine (ATARAX/VISTARIL) tablet 25 mg  25 mg Oral Daily Emokpae, Ejiroghene E, MD      . levothyroxine (SYNTHROID) tablet 50 mcg  50 mcg Oral Daily Emokpae, Ejiroghene E, MD      . mirabegron ER (MYRBETRIQ) tablet 25 mg  25 mg Oral Daily Emokpae, Ejiroghene E, MD      . ondansetron (ZOFRAN) tablet 4 mg  4 mg Oral Q6H PRN Emokpae, Ejiroghene E, MD       Or   . ondansetron (ZOFRAN) injection 4 mg  4 mg Intravenous Q6H PRN Emokpae, Ejiroghene E, MD      . polyethylene glycol (MIRALAX / GLYCOLAX) packet 17 g  17 g Oral Daily PRN Emokpae, Ejiroghene E, MD      . sertraline (ZOLOFT) tablet 100 mg  100 mg Oral Daily Emokpae, Ejiroghene E, MD       Current Outpatient Medications  Medication Sig Dispense Refill  . aspirin 81 MG tablet Take 81 mg by mouth daily.    . baclofen (LIORESAL) 10 MG tablet Take 10 mg by mouth 2 (two) times daily as needed.     . bismuth subsalicylate (PEPTO BISMOL) 262 MG/15ML suspension Take 30 mLs by mouth every 6 (six) hours as needed.    . Cyanocobalamin (B-12 PO) Take 1 tablet by mouth daily.     . diclofenac (VOLTAREN) 75 MG EC tablet Take 75 mg by mouth 2 (two) times daily.  0  . ELMIRON 100 MG capsule Take 100 mg by mouth 3 (three) times daily. Reported on 05/12/2015    . furosemide (LASIX) 20 MG tablet Take 20 mg by mouth daily.    Marland Kitchen GARLIC PO Take 1 tablet by mouth daily.     . Ginger, Zingiber officinalis, (GINGER ROOT) 550 MG CAPS Take 1 capsule by mouth daily.     . Glucosamine 500 MG CAPS Take 1,000 mg by mouth in the morning and at bedtime.     . hydrOXYzine (ATARAX/VISTARIL) 25 MG tablet Take 25 mg by mouth daily.     Marland Kitchen levothyroxine (SYNTHROID, LEVOTHROID) 50 MCG tablet Take 1 tablet by mouth daily.  0  . lisinopril-hydrochlorothiazide (ZESTORETIC) 20-12.5 MG tablet Take 2 tablets by mouth daily.     . Multiple Vitamin (MULTIVITAMIN) tablet Take 1 tablet by mouth every other day.    Marland Kitchen MYRBETRIQ 25 MG TB24 tablet Take 25 mg by mouth daily.  2  . Omega-3 Fatty Acids (FISH OIL) 1200 MG CAPS Take 1,200 mg by mouth daily.     Marland Kitchen omeprazole (PRILOSEC) 20 MG capsule Take 1 capsule (20 mg total) by mouth daily. 30 capsule 5  . ondansetron (ZOFRAN) 4 MG tablet Take 1 tablet (4 mg total) by mouth every 8 (eight) hours as needed for nausea or vomiting. 30 tablet 2  . sertraline (ZOLOFT) 50 MG tablet Take 2 tablets by mouth  daily.       Allergies as of 02/26/2020 - Review Complete 02/26/2020  Allergen Reaction Noted  . Codeine Other (See Comments) 04/29/2015  . Penicillins Hives 04/29/2015  . Phenazopyridine Hives 04/29/2015  . Pravastatin Other (See Comments)  04/29/2015  . Septra [sulfamethoxazole-trimethoprim] Hives 04/29/2015  . Simvastatin Other (See Comments) 04/29/2015  . Zetia [ezetimibe] Other (See Comments) 04/29/2015    Family History  Problem Relation Age of Onset  . Endometrial cancer Mother        died old age, 18  . Lung cancer Father   . Colon cancer Neg Hx   . Stomach cancer Neg Hx   . Pancreatic cancer Neg Hx   . Inflammatory bowel disease Neg Hx     Social History   Socioeconomic History  . Marital status: Divorced    Spouse name: Not on file  . Number of children: 2  . Years of education: Not on file  . Highest education level: Not on file  Occupational History  . Not on file  Tobacco Use  . Smoking status: Never Smoker  . Smokeless tobacco: Never Used  Vaping Use  . Vaping Use: Never used  Substance and Sexual Activity  . Alcohol use: No    Alcohol/week: 0.0 standard drinks  . Drug use: No  . Sexual activity: Not Currently    Birth control/protection: Surgical  Other Topics Concern  . Not on file  Social History Narrative  . Not on file   Social Determinants of Health   Financial Resource Strain: Low Risk   . Difficulty of Paying Living Expenses: Not very hard  Food Insecurity: No Food Insecurity  . Worried About Programme researcher, broadcasting/film/video in the Last Year: Never true  . Ran Out of Food in the Last Year: Never true  Transportation Needs: No Transportation Needs  . Lack of Transportation (Medical): No  . Lack of Transportation (Non-Medical): No  Physical Activity: Insufficiently Active  . Days of Exercise per Week: 4 days  . Minutes of Exercise per Session: 20 min  Stress: No Stress Concern Present  . Feeling of Stress : Not at all  Social Connections:  Moderately Isolated  . Frequency of Communication with Friends and Family: More than three times a week  . Frequency of Social Gatherings with Friends and Family: More than three times a week  . Attends Religious Services: Never  . Active Member of Clubs or Organizations: No  . Attends Banker Meetings: Never  . Marital Status: Married  Catering manager Violence: Not At Risk  . Fear of Current or Ex-Partner: No  . Emotionally Abused: No  . Physically Abused: No  . Sexually Abused: No    Review of Systems: General: Negative for anorexia, weight loss, fever, chills. ENT: Negative for hoarseness, difficulty swallowing. CV: Negative for chest pain, angina, palpitations, peripheral edema.  Respiratory: Negative for dyspnea at rest, cough, sputum, wheezing.  GI: See history of present illness. Endo: Negative for unusual weight change.  Heme: Negative for bruising or bleeding. Allergy: Negative for rash or hives.  Physical Exam: Vital signs in last 24 hours: Temp:  [97.9 F (36.6 C)] 97.9 F (36.6 C) (11/04 1338) Pulse Rate:  [61-99] 69 (11/05 0900) Resp:  [10-21] 15 (11/05 0900) BP: (80-132)/(39-63) 120/47 (11/05 0900) SpO2:  [90 %-100 %] 98 % (11/05 0900) FiO2 (%):  [21 %] 21 % (11/04 2256) Weight:  [70.8 kg] 70.8 kg (11/04 1336)   General:   Alert,  Well-developed, well-nourished, pleasant and cooperative in NAD Head:  Normocephalic and atraumatic. Eyes:  Sclera clear, no icterus. Conjunctiva pink. Ears:  Normal auditory acuity. Neck:  Supple; no masses or thyromegaly. Lungs:  Clear throughout to auscultation. No wheezes, crackles, or  rhonchi. No acute distress. Heart:  Regular rate and rhythm; no murmurs, clicks, rubs, or gallops. Abdomen:  Soft, nontender and nondistended. No masses, hepatosplenomegaly or hernias noted. Normal bowel sounds, without guarding, and without rebound.   Rectal:  Deferred.   Pulses:  Normal bilateral DP pulses noted. Extremities:   Without clubbing or edema. Neurologic:  Alert and  oriented x4;  grossly normal neurologically. Psych:  Alert and cooperative. Normal mood and affect.  Intake/Output from previous day: 11/04 0701 - 11/05 0700 In: 1465.7 [IV Piggyback:1465.7] Out: -  Intake/Output this shift: No intake/output data recorded.  Lab Results: Recent Labs    02/26/20 1901 02/27/20 0424  WBC 7.5 6.4  HGB 9.4* 9.0*  HCT 31.1* 28.9*  PLT 337 306   BMET Recent Labs    02/26/20 1901 02/27/20 0424  NA 140 142  K 3.2* 3.7  CL 105 109  CO2 26 25  GLUCOSE 112* 91  BUN 27* 21  CREATININE 1.37* 1.02*  CALCIUM 8.7* 8.5*   LFT Recent Labs    02/26/20 1901  PROT 5.3*  ALBUMIN 2.5*  AST 18  ALT 12  ALKPHOS 19*  BILITOT 0.5   PT/INR No results for input(s): LABPROT, INR in the last 72 hours. Hepatitis Panel No results for input(s): HEPBSAG, HCVAB, HEPAIGM, HEPBIGM in the last 72 hours. C-Diff No results for input(s): CDIFFTOX in the last 72 hours.  Studies/Results: DG Chest 2 View  Result Date: 02/26/2020 CLINICAL DATA:  Hypertension EXAM: CHEST - 2 VIEW COMPARISON:  None FINDINGS: Cardiomediastinal contours and hilar structures are normal. Lungs are clear. No sign of effusion. Limited assessment of skeletal structures are is unremarkable. IMPRESSION: No active cardiopulmonary disease. Electronically Signed   By: Donzetta Kohut M.D.   On: 02/26/2020 19:00   CT Abdomen Pelvis W Contrast  Result Date: 02/26/2020 CLINICAL DATA:  Hypotension.  Diverticulitis suspected. EXAM: CT ABDOMEN AND PELVIS WITH CONTRAST TECHNIQUE: Multidetector CT imaging of the abdomen and pelvis was performed using the standard protocol following bolus administration of intravenous contrast. CONTRAST:  25mL OMNIPAQUE IOHEXOL 300 MG/ML  SOLN COMPARISON:  11/11/2019 FINDINGS: Lower chest: No acute abnormality Hepatobiliary: Prior cholecystectomy. Mild biliary ductal dilatation is stable since prior study likely related to post  cholecystectomy state. No focal hepatic abnormality. Pancreas: No focal abnormality or ductal dilatation. Spleen: No focal abnormality.  Normal size. Adrenals/Urinary Tract: No adrenal abnormality. No focal renal abnormality. No stones or hydronephrosis. Urinary bladder is unremarkable. Stomach/Bowel: Left colonic diverticulosis. No evidence of diverticulitis. Stomach and small bowel grossly unremarkable. Vascular/Lymphatic: Aortoiliac atherosclerosis. No evidence of aneurysm or adenopathy. Reproductive: Prior hysterectomy.  No adnexal masses. Other: No free fluid or free air. Musculoskeletal: No acute bony abnormality. Diffuse degenerative changes throughout the lumbar spine. IMPRESSION: Left colonic diverticulosis.  No active diverticulitis. Aortic atherosclerosis. No acute findings. Electronically Signed   By: Charlett Nose M.D.   On: 02/26/2020 20:35    Impression: Very pleasant 74 year old female who was previously seen in our office in September for persistent loose stools.  Stool studies were negative, other labs essentially normal.  Recommended addition of Imodium and outpatient colonoscopy. Since that time she is continued to have loose stools, although smaller in volume and less frequency.    Diarrhea: Persistent with about 2-3 stools a day, described as loose but not watery.  Denies associated abdominal pain.  She has had some intermittent nausea, 2 episodes of vomiting in the past several days.  No hematochezia, melena, hematemesis.  No fever  or chills.  At this point I doubt occult infection.  She does have hypothyroidism but recent TSH 12/2019 was normal. Differentials include dietary intolerances, medication effect, collagenous colitis, exocrine pancreatic insufficiency.  Cannot rule out more occult process, although recent normal colonoscopy in 2017 is reassuring.  Anemia: The patient denies overt GI bleed.  However, she did have heme positive stool and her hemoglobin is trended down from 12.16  months ago to 11.57-month ago, 9.4 on admission, 9.0 today.  Indices are normal.  Iron saturations are low but ferritin is normal.  Query possible hemorrhoidal bleed ongoing with frequent loose stools.  Recent colonoscopy reassuring.  She does have a scheduled outpatient colonoscopy.  Plan: 1. Plan scheduled Zofran every 8 hours 2. Phenergan 12.5 mg as needed breakthrough nausea 3. Follow hgb 4. Transfuse as necessary 5. If worsening symptoms can consider IP colonoscopy 6. Will plan more likely outpatient colonoscopy, will try and bump up dat of procedure 7. Supportive measures   Thank you for allowing [place] to participate in the care of Rick Duff, DNP, AGNP-C Adult & Gerontological Nurse Practitioner [facility]    LOS: 0 days     02/27/2020, 10:05 AM

## 2020-02-27 NOTE — Progress Notes (Signed)
*  PRELIMINARY RESULTS* Echocardiogram 2D Echocardiogram has been performed.  Charlotte Woods 02/27/2020, 9:14 AM

## 2020-02-27 NOTE — ED Notes (Signed)
Update to Casimiro Needle, RN  Bed assigned but not marked ready

## 2020-02-27 NOTE — Progress Notes (Signed)
PROGRESS NOTE    Charlotte Woods  WUJ:[id number] DOB: 1945-07-28 DOA: 02/26/2020 PCP: Altamease Oiler, FNP   Brief Narrative:  Per HPI: Charlotte Woods is a 74 y.o. female with medical history significant for diverticulosis, hypertension, interstitial cystitis.  Patient went to see her primary care provider today and was found to be hypotensive, blood pressure 80/40, heart rate 120s so she was sent to the ED. Patient reports intermittent episodes of dizziness and lightheadedness when standing over the past month.  Patient has had diarrhea over the past 2 months.  She reports initially she was having at least 5-7 bowel movements every day, watery stools.  No blood.  She was started on loperamide and this reduced the bowel movements to at least 2 watery stools every day.  She is taking loperamide every day and has finished 4 packets she says. She had one episode of vomiting today after she took a lot of Pepto-Bismol, otherwise no episodes of vomiting, no abdominal pain. Patient reports chronic poor oral intake, and significant weight loss over the past 3 months.  Per charts patient has had 20 pound weight loss since May.  Patient also reports bilateral lower extremity swelling 2 weeks ago, she was started on Lasix 20 mg daily about a week ago, and this significantly improved her lower extremity swelling.  She denies chest pain, no difficulty breathing.  She denies black stools, no blood in stools, no vomiting of blood.  Patient followed up with her gastroenterologist for her diarrhea, she had stool work-up done, and outpatient colonoscopy planned.  11/5: Patient has been admitted with symptomatic hypotension in the setting of dehydration related to recent Lasix use as well as chronic diarrhea over the last 2 months.  She is also noted to have acute anemia and is noted to be iron deficient for which iron infusion will be administered.  GI following to assist with management of diarrhea.  2D  echocardiogram performed with LVEF 65-70% and grade 1 diastolic dysfunction noted.  Her blood pressures are slowly improving with IV fluid administration.  Assessment & Plan:   Principal Problem:   Hypotension Active Problems:   Chronic diarrhea   Acute anemia   Symptomatic hypotension related to dehydration -Continue IV fluid with improvement noted in hypotension -Check orthostatics by a.m. prior to considering discharge to ensure stability -Appreciate GI evaluation for control of diarrhea  Chronic diarrhea -Of 2 months duration -GI pathogen panel and C. difficile negative -TSH, IgA, and tissue transglutaminase antibody levels normal -Plan for colonoscopy likely in outpatient setting -Continue on loperamide -Further GI recommendations appreciated  Acute anemia -Mildly downtrending with no overt bleeding -Last EGD 08/2019 unremarkable and colonoscopy 2017 with diverticulosis and internal hemorrhoids -Anemia panel demonstrating iron deficiency for which Feraheme will be provided -Monitor repeat CBC and transfuse as needed for hemoglobin less than 7  Bilateral lower extremity edema -Recent DVT testing done in [place] which was negative -Likely related to hypoalbuminemia of 2.5 -Holding Lasix for now -2D echocardiogram with LVEF 65-70% and grade 1 diastolic dysfunction  History of hypertension, currently hypotensive -Hold home antihypertensives at this time  Protein calorie malnutrition -Weight loss related to poor oral intake with ongoing chronic diarrhea -Dietitian consultation with need for supplementation  Interstitial cystitis -Hold Elmiron for now due to adverse effects of diarrhea, but unlikely this is contributing as patient has been on this medication for the last 2 years  Hypokalemia-improved -Continue IV fluid with potassium supplementation and monitor  DVT prophylaxis: SCDs  Code Status: Full code Family Communication: Tried calling spouse on 11/5 with no  response Disposition Plan:  Status is: Observation  The patient will require care spanning > 2 midnights and should be moved to inpatient because: IV treatments appropriate due to intensity of illness or inability to take PO  Dispo: The patient is from: Home              Anticipated d/c is to: Home              Anticipated d/c date is: 1 day              Patient currently is not medically stable to d/c.  Patient continues to have soft blood pressure readings requiring IV fluid.  Consultants:   GI  Procedures:   See below  Antimicrobials:   None   Subjective: Patient seen and evaluated today with ongoing diarrhea noted and some symptomatic hypotension with lightheadedness, but she claims this is slowly improving.  Objective: Vitals:   02/27/20 0630 02/27/20 0700 02/27/20 0900 02/27/20 1131  BP: 114/60 (!) 95/50 (!) 120/47 (!) 104/56  Pulse: 67 69 69 80  Resp: 15 14 15 16   Temp:      TempSrc:      SpO2: 95% 95% 98% 99%  Weight:      Height:        Intake/Output Summary (Last 24 hours) at 02/27/2020 1145 Last data filed at 02/26/2020 2235 Gross per 24 hour  Intake 1465.7 ml  Output --  Net 1465.7 ml   Filed Weights   02/26/20 1336  Weight: 70.8 kg    Examination:  General exam: Appears calm and comfortable  Respiratory system: Clear to auscultation. Respiratory effort normal. Cardiovascular system: S1 & S2 heard, RRR.  2/6 murmur noted. Gastrointestinal system: Abdomen is nondistended, soft and nontender.  Central nervous system: Alert and oriented.  Extremities: Symmetric 5 x 5 power.  Noted puffy, diffuse edema. Skin: No rashes, lesions or ulcers Psychiatry: Judgement and insight appear normal. Mood & affect appropriate.     Data Reviewed: I have personally reviewed following labs and imaging studies  CBC: Recent Labs  Lab 02/26/20 1901 02/27/20 0424  WBC 7.5 6.4  NEUTROABS 3.3  --   HGB 9.4* 9.0*  HCT 31.1* 28.9*  MCV 84.3 84.8  PLT 337 306    Basic Metabolic Panel: Recent Labs  Lab 02/26/20 1901 02/27/20 0424  NA 140 142  K 3.2* 3.7  CL 105 109  CO2 26 25  GLUCOSE 112* 91  BUN 27* 21  CREATININE 1.37* 1.02*  CALCIUM 8.7* 8.5*  MG 1.7  --    GFR: Estimated Creatinine Clearance: 48.7 mL/min (A) (by C-G formula based on SCr of 1.02 mg/dL (H)). Liver Function Tests: Recent Labs  Lab 02/26/20 1901  AST 18  ALT 12  ALKPHOS 19*  BILITOT 0.5  PROT 5.3*  ALBUMIN 2.5*   No results for input(s): LIPASE, AMYLASE in the last 168 hours. No results for input(s): AMMONIA in the last 168 hours. Coagulation Profile: No results for input(s): INR, PROTIME in the last 168 hours. Cardiac Enzymes: No results for input(s): CKTOTAL, CKMB, CKMBINDEX, TROPONINI in the last 168 hours. BNP (last 3 results) No results for input(s): PROBNP in the last 8760 hours. HbA1C: No results for input(s): HGBA1C in the last 72 hours. CBG: No results for input(s): GLUCAP in the last 168 hours. Lipid Profile: No results for input(s): CHOL, HDL, LDLCALC, TRIG, CHOLHDL, LDLDIRECT in  the last 72 hours. Thyroid Function Tests: No results for input(s): TSH, T4TOTAL, FREET4, T3FREE, THYROIDAB in the last 72 hours. Anemia Panel: Recent Labs    02/27/20 0424  VITAMINB12 751  FOLATE 44.5  FERRITIN 135  TIBC 361  IRON 23*  RETICCTPCT 2.4   Sepsis Labs: No results for input(s): PROCALCITON, LATICACIDVEN in the last 168 hours.  Recent Results (from the past 240 hour(s))  Respiratory Panel by RT PCR (Flu A&B, Covid) - Nasopharyngeal Swab     Status: None   Collection Time: 02/26/20  8:49 PM   Specimen: Nasopharyngeal Swab  Result Value Ref Range Status   SARS Coronavirus 2 by RT PCR NEGATIVE NEGATIVE Final    Comment: (NOTE) SARS-CoV-2 target nucleic acids are NOT DETECTED.  The SARS-CoV-2 RNA is generally detectable in upper respiratoy specimens during the acute phase of infection. The lowest concentration of SARS-CoV-2 viral copies this  assay can detect is 131 copies/mL. A negative result does not preclude SARS-Cov-2 infection and should not be used as the sole basis for treatment or other patient management decisions. A negative result may occur with  improper specimen collection/handling, submission of specimen other than nasopharyngeal swab, presence of viral mutation(s) within the areas targeted by this assay, and inadequate number of viral copies (<131 copies/mL). A negative result must be combined with clinical observations, patient history, and epidemiological information. The expected result is Negative.  Fact Sheet for Patients:  https://www.moore.com/  Fact Sheet for Healthcare Providers:  https://www.young.biz/  This test is no t yet approved or cleared by the [place] FDA and  has been authorized for detection and/or diagnosis of SARS-CoV-2 by FDA under an Emergency Use Authorization (EUA). This EUA will remain  in effect (meaning this test can be used) for the duration of the COVID-19 declaration under Section 564(b)(1) of the Act, 21 U.S.C. section 360bbb-3(b)(1), unless the authorization is terminated or revoked sooner.     Influenza A by PCR NEGATIVE NEGATIVE Final   Influenza B by PCR NEGATIVE NEGATIVE Final    Comment: (NOTE) The Xpert Xpress SARS-CoV-2/FLU/RSV assay is intended as an aid in  the diagnosis of influenza from Nasopharyngeal swab specimens and  should not be used as a sole basis for treatment. Nasal washings and  aspirates are unacceptable for Xpert Xpress SARS-CoV-2/FLU/RSV  testing.  Fact Sheet for Patients: https://www.moore.com/  Fact Sheet for Healthcare Providers: https://www.young.biz/  This test is not yet approved or cleared by the [place] FDA and  has been authorized for detection and/or diagnosis of SARS-CoV-2 by  FDA under an Emergency Use Authorization (EUA). This EUA will  remain  in effect (meaning this test can be used) for the duration of the  Covid-19 declaration under Section 564(b)(1) of the Act, 21  U.S.C. section 360bbb-3(b)(1), unless the authorization is  terminated or revoked. Performed at [facility], [address] [place], [place] [address]          Radiology Studies: DG Chest 2 View  Result Date: 02/26/2020 CLINICAL DATA:  Hypertension EXAM: CHEST - 2 VIEW COMPARISON:  None FINDINGS: Cardiomediastinal contours and hilar structures are normal. Lungs are clear. No sign of effusion. Limited assessment of skeletal structures are is unremarkable. IMPRESSION: No active cardiopulmonary disease. Electronically Signed   By: Donzetta Kohut M.D.   On: 02/26/2020 19:00   CT Abdomen Pelvis W Contrast  Result Date: 02/26/2020 CLINICAL DATA:  Hypotension.  Diverticulitis suspected. EXAM: CT ABDOMEN AND PELVIS WITH CONTRAST TECHNIQUE: Multidetector CT imaging  of the abdomen and pelvis was performed using the standard protocol following bolus administration of intravenous contrast. CONTRAST:  80mL OMNIPAQUE IOHEXOL 300 MG/ML  SOLN COMPARISON:  11/11/2019 FINDINGS: Lower chest: No acute abnormality Hepatobiliary: Prior cholecystectomy. Mild biliary ductal dilatation is stable since prior study likely related to post cholecystectomy state. No focal hepatic abnormality. Pancreas: No focal abnormality or ductal dilatation. Spleen: No focal abnormality.  Normal size. Adrenals/Urinary Tract: No adrenal abnormality. No focal renal abnormality. No stones or hydronephrosis. Urinary bladder is unremarkable. Stomach/Bowel: Left colonic diverticulosis. No evidence of diverticulitis. Stomach and small bowel grossly unremarkable. Vascular/Lymphatic: Aortoiliac atherosclerosis. No evidence of aneurysm or adenopathy. Reproductive: Prior hysterectomy.  No adnexal masses. Other: No free fluid or free air. Musculoskeletal: No acute bony abnormality. Diffuse degenerative changes  throughout the lumbar spine. IMPRESSION: Left colonic diverticulosis.  No active diverticulitis. Aortic atherosclerosis. No acute findings. Electronically Signed   By: Charlett Nose M.D.   On: 02/26/2020 20:35   ECHOCARDIOGRAM COMPLETE  Result Date: 02/27/2020    ECHOCARDIOGRAM REPORT   Patient Name:   Charlotte Woods Date of Exam: 02/27/2020 Medical Rec #:  [id number]     Height:       64.5 in Accession #:    [id number]    Weight:       156.0 lb Date of Birth:  [birth year]     BSA:          1.770 m Patient Age:    74 years      BP:           95/50 mmHg Patient Gender: F             HR:           69 bpm. [facility] Procedure: 2D Echo Indications:    Postural dizziness with presyncope [[id number]  History:        Patient has no prior history of Echocardiogram examinations.                 Risk Factors:Non-Smoker. GERD, Hypotension.  Sonographer:    Jeryl Columbia RDCS (AE) Referring Phys: [id number] Heloise Beecham [facility] IMPRESSIONS  1. Left ventricular ejection fraction, by estimation, is 65 to 70%. The left ventricle has normal function. The left ventricle has no regional wall motion abnormalities. Left ventricular diastolic parameters are consistent with Grade I diastolic dysfunction (impaired relaxation). Elevated left atrial pressure.  2. Right ventricular systolic function is normal. The right ventricular size is normal.  3. The mitral valve is normal in structure. No evidence of mitral valve regurgitation. No evidence of mitral stenosis.  4. The aortic valve is tricuspid. Aortic valve regurgitation is not visualized. No aortic stenosis is present.  5. The inferior vena cava is normal in size with greater than 50% respiratory variability, suggesting right atrial pressure of 3 mmHg. FINDINGS  Left Ventricle: Left ventricular ejection fraction, by estimation, is 65 to 70%. The left ventricle has normal function. The left ventricle has no regional wall motion abnormalities. The left ventricular internal  cavity size was normal in size. There is  no left ventricular hypertrophy. Left ventricular diastolic parameters are consistent with Grade I diastolic dysfunction (impaired relaxation). Elevated left atrial pressure. Right Ventricle: The right ventricular size is normal. No increase in right ventricular wall thickness. Right ventricular systolic function is normal. Left Atrium: Left atrial size was normal in size. Right Atrium: Right atrial size was normal in size. Pericardium: There is no evidence of pericardial  effusion. Mitral Valve: The mitral valve is normal in structure. No evidence of mitral valve regurgitation. No evidence of mitral valve stenosis. Tricuspid Valve: The tricuspid valve is normal in structure. Tricuspid valve regurgitation is not demonstrated. No evidence of tricuspid stenosis. Aortic Valve: The aortic valve is tricuspid. Aortic valve regurgitation is not visualized. No aortic stenosis is present. Aortic valve mean gradient measures 5.4 mmHg. Aortic valve peak gradient measures 11.0 mmHg. Aortic valve area, by VTI measures 2.23  cm. Pulmonic Valve: The pulmonic valve was not well visualized. Pulmonic valve regurgitation is not visualized. No evidence of pulmonic stenosis. Aorta: The aortic root is normal in size and structure. Pulmonary Artery: 26. Venous: The inferior vena cava is normal in size with greater than 50% respiratory variability, suggesting right atrial pressure of 3 mmHg. IAS/Shunts: No atrial level shunt detected by color flow Doppler.  LEFT VENTRICLE PLAX 2D LVIDd:         4.48 cm  Diastology LVIDs:         2.21 cm  LV e' medial:    5.66 cm/s LV PW:         1.07 cm  LV E/e' medial:  17.2 LV IVS:        0.96 cm  LV e' lateral:   5.44 cm/s LVOT diam:     1.90 cm  LV E/e' lateral: 17.9 LV SV:         80 LV SV Index:   45 LVOT Area:     2.84 cm  RIGHT VENTRICLE RV S prime:     24.60 cm/s TAPSE (M-mode): 2.4 cm LEFT ATRIUM           Index       RIGHT ATRIUM           Index LA  diam:      3.20 cm 1.81 cm/m  RA Area:     11.00 cm LA Vol (A2C): 56.0 ml 31.64 ml/m RA Volume:   23.10 ml  13.05 ml/m LA Vol (A4C): 43.9 ml 24.81 ml/m  AORTIC VALVE AV Area (Vmax):    2.31 cm AV Area (Vmean):   2.09 cm AV Area (VTI):     2.23 cm AV Vmax:           165.60 cm/s AV Vmean:          111.260 cm/s AV VTI:            0.360 m AV Peak Grad:      11.0 mmHg AV Mean Grad:      5.4 mmHg LVOT Vmax:         134.97 cm/s LVOT Vmean:        82.167 cm/s LVOT VTI:          0.284 m LVOT/AV VTI ratio: 0.79  AORTA Ao Root diam: 2.80 cm MITRAL VALVE                TRICUSPID VALVE MV Area (PHT): 2.93 cm     TR Peak grad:   26.0 mmHg MV Decel Time: 259 msec     TR Vmax:        255.00 cm/s MV E velocity: 97.30 cm/s MV A velocity: 114.00 cm/s  SHUNTS MV E/A ratio:  0.85         Systemic VTI:  0.28 m  Systemic Diam: 1.90 cm Dina Rich MD Electronically signed by Dina Rich MD Signature Date/Time: 02/27/2020/10:18:54 AM    Final         Scheduled Meds: . hydrOXYzine  25 mg Oral Daily  . levothyroxine  50 mcg Oral Daily  . mirabegron ER  25 mg Oral Daily  . ondansetron  4 mg Oral Q6H  . sertraline  100 mg Oral Daily   Continuous Infusions: . 0.9 % NaCl with KCl 40 mEq / L 100 mL/hr (02/27/20 1025)  . ferumoxytol       LOS: 0 days    Time spent: 35 minutes    Sharlize Hoar Hoover Brunette, DO [facility]  If 7PM-7AM, please contact night-coverage [email or web address] 02/27/2020, 11:45 AM

## 2020-02-27 NOTE — Telephone Encounter (Signed)
Is there any way we can bump this patient's colonoscopy up to sooner? She ended up with persistent diarrhea requiring hospitalization with a 2-3 gm drop in hgb.  Will cc Ermalinda Memos, PA as well.

## 2020-02-28 DIAGNOSIS — E861 Hypovolemia: Secondary | ICD-10-CM

## 2020-02-28 DIAGNOSIS — I9589 Other hypotension: Secondary | ICD-10-CM | POA: Diagnosis not present

## 2020-02-28 LAB — BASIC METABOLIC PANEL
Anion gap: 8 (ref 5–15)
BUN: 13 mg/dL (ref 8–23)
CO2: 24 mmol/L (ref 22–32)
Calcium: 8.8 mg/dL — ABNORMAL LOW (ref 8.9–10.3)
Chloride: 111 mmol/L (ref 98–111)
Creatinine, Ser: 0.73 mg/dL (ref 0.44–1.00)
GFR, Estimated: >60 mL/min (ref >60)
Glucose, Bld: 92 mg/dL (ref 70–99)
Potassium: 4.3 mmol/L (ref 3.5–5.1)
Sodium: 143 mmol/L (ref 135–145)

## 2020-02-28 LAB — CBC
HCT: 29.8 % — ABNORMAL LOW (ref 36.0–46.0)
Hemoglobin: 9 g/dL — ABNORMAL LOW (ref 12.0–15.0)
MCH: 26 pg (ref 26.0–34.0)
MCHC: 30.2 g/dL (ref 30.0–36.0)
MCV: 86.1 fL (ref 80.0–100.0)
Platelets: 306 10*3/uL (ref 150–400)
RBC: 3.46 MIL/uL — ABNORMAL LOW (ref 3.87–5.11)
RDW: 17.5 % — ABNORMAL HIGH (ref 11.5–15.5)
WBC: 7 10*3/uL (ref 4.0–10.5)
nRBC: 0 % (ref 0.0–0.2)

## 2020-02-28 LAB — MAGNESIUM: Magnesium: 1.4 mg/dL — ABNORMAL LOW (ref 1.7–2.4)

## 2020-02-28 LAB — MRSA PCR SCREENING: MRSA by PCR: NEGATIVE

## 2020-02-28 MED ORDER — MAGNESIUM SULFATE 2 GM/50ML IV SOLN
2.0000 g | Freq: Once | INTRAVENOUS | Status: AC
  Administered 2020-02-28: 2 g via INTRAVENOUS
  Filled 2020-02-28: qty 50

## 2020-02-28 MED ORDER — DICYCLOMINE HCL 10 MG PO CAPS: 10.0000 mg | ORAL_CAPSULE | Freq: Three times a day (TID) | ORAL | 0 refills | Status: AC

## 2020-02-28 MED ORDER — LOPERAMIDE HCL 2 MG PO CAPS: 2.0000 mg | ORAL_CAPSULE | ORAL | 2 refills | Status: DC | PRN

## 2020-02-28 NOTE — Progress Notes (Signed)
Patient states had a good night.  Some formed stool overnight.  No rectal bleeding observed  Vital signs in last 24 hours: Temp:  [98 F (36.7 C)-98.9 F (37.2 C)] 98.9 F (37.2 C) (11/06 0745) Pulse Rate:  [64-161] 77 (11/06 0800) Resp:  [9-34] 11 (11/06 0800) BP: (104-143)/(44-113) 131/48 (11/06 0800) SpO2:  [93 %-100 %] 99 % (11/06 0800) Last BM Date: 02/27/20 General:   Alert,   pleasant and cooperative in NAD Abdomen: Nondistended.  Positive bowel sounds soft and nontender without appreciable mass organomegaly Extremities:  Without clubbing or edema.    Intake/Output from previous day: 11/05 0701 - 11/06 0700 In: 1241.3 [I.V.:1241.3] Out: -  Intake/Output this shift: No intake/output data recorded.  Lab Results: Recent Labs    02/26/20 1901 02/27/20 0424 02/28/20 0403  WBC 7.5 6.4 7.0  HGB 9.4* 9.0* 9.0*  HCT 31.1* 28.9* 29.8*  PLT 337 306 306   BMET Recent Labs    02/26/20 1901 02/27/20 0424 02/28/20 0403  NA 140 142 143  K 3.2* 3.7 4.3  CL 105 109 111  CO2 26 25 24   GLUCOSE 112* 91 92  BUN 27* 21 13  CREATININE 1.37* 1.02* 0.73  CALCIUM 8.7* 8.5* 8.8*   LFT Recent Labs    02/26/20 1901  PROT 5.3*  ALBUMIN 2.5*  AST 18  ALT 12  ALKPHOS 19*  BILITOT 0.5   PT/INR No results for input(s): LABPROT, INR in the last 72 hours. Hepatitis Panel No results for input(s): HEPBSAG, HCVAB, HEPAIGM, HEPBIGM in the last 72 hours. C-Diff No results for input(s): CDIFFTOX in the last 72 hours.  Studies/Results: DG Chest 2 View  Result Date: 02/26/2020 CLINICAL DATA:  Hypertension EXAM: CHEST - 2 VIEW COMPARISON:  None FINDINGS: Cardiomediastinal contours and hilar structures are normal. Lungs are clear. No sign of effusion. Limited assessment of skeletal structures are is unremarkable. IMPRESSION: No active cardiopulmonary disease. Electronically Signed   By: 13/07/2019 M.D.   On: 02/26/2020 19:00   CT Abdomen Pelvis W Contrast  Result Date:  02/26/2020 CLINICAL DATA:  Hypotension.  Diverticulitis suspected. EXAM: CT ABDOMEN AND PELVIS WITH CONTRAST TECHNIQUE: Multidetector CT imaging of the abdomen and pelvis was performed using the standard protocol following bolus administration of intravenous contrast. CONTRAST:  70mL OMNIPAQUE IOHEXOL 300 MG/ML  SOLN COMPARISON:  11/11/2019 FINDINGS: Lower chest: No acute abnormality Hepatobiliary: Prior cholecystectomy. Mild biliary ductal dilatation is stable since prior study likely related to post cholecystectomy state. No focal hepatic abnormality. Pancreas: No focal abnormality or ductal dilatation. Spleen: No focal abnormality.  Normal size. Adrenals/Urinary Tract: No adrenal abnormality. No focal renal abnormality. No stones or hydronephrosis. Urinary bladder is unremarkable. Stomach/Bowel: Left colonic diverticulosis. No evidence of diverticulitis. Stomach and small bowel grossly unremarkable. Vascular/Lymphatic: Aortoiliac atherosclerosis. No evidence of aneurysm or adenopathy. Reproductive: Prior hysterectomy.  No adnexal masses. Other: No free fluid or free air. Musculoskeletal: No acute bony abnormality. Diffuse degenerative changes throughout the lumbar spine. IMPRESSION: Left colonic diverticulosis.  No active diverticulitis. Aortic atherosclerosis. No acute findings. Electronically Signed   By: 11/13/2019 M.D.   On: 02/26/2020 20:35   ECHOCARDIOGRAM COMPLETE  Result Date: 02/27/2020    ECHOCARDIOGRAM REPORT   Patient Name:   Charlotte Woods Date of Exam: 02/27/2020 Medical Rec #:  13/08/[id number]     Height:       64.5 in Accession #:    [id number]    Weight:       156.0 lb  Date of Birth:  08/26/1945     BSA:          1.770 m Patient Age:    74 years      BP:           95/50 mmHg Patient Gender: F             HR:           69 bpm. [facility] Procedure: 2D Echo Indications:    Postural dizziness with presyncope [[id number]  History:        Patient has no prior history of Echocardiogram  examinations.                 Risk Factors:Non-Smoker. GERD, Hypotension.  Sonographer:    Jeryl Columbia RDCS (AE) Referring Phys: [id number] Hel[address] [facility] IMPRESSIONS  1. Left ventricular ejection fraction, by estimation, is 65 to 70%. The left ventricle has normal function. The left ventricle has no regional wall motion abnormalities. Left ventricular diastolic parameters are consistent with Grade I diastolic dysfunction (impaired relaxation). Elevated left atrial pressure.  2. Right ventricular systolic function is normal. The right ventricular size is normal.  3. The mitral valve is normal in structure. No evidence of mitral valve regurgitation. No evidence of mitral stenosis.  4. The aortic valve is tricuspid. Aortic valve regurgitation is not visualized. No aortic stenosis is present.  5. The inferior vena cava is normal in size with greater than 50% respiratory variability, suggesting right atrial pressure of 3 mmHg. FINDINGS  Left Ventricle: Left ventricular ejection fraction, by estimation, is 65 to 70%. The left ventricle has normal function. The left ventricle has no regional wall motion abnormalities. The left ventricular internal cavity size was normal in size. There is  no left ventricular hypertrophy. Left ventricular diastolic parameters are consistent with Grade I diastolic dysfunction (impaired relaxation). Elevated left atrial pressure. Right Ventricle: The right ventricular size is normal. No increase in right ventricular wall thickness. Right ventricular systolic function is normal. Left Atrium: Left atrial size was normal in size. Right Atrium: Right atrial size was normal in size. Pericardium: There is no evidence of pericardial effusion. Mitral Valve: The mitral valve is normal in structure. No evidence of mitral valve regurgitation. No evidence of mitral valve stenosis. Tricuspid Valve: The tricuspid valve is normal in structure. Tricuspid valve regurgitation is not demonstrated. No  evidence of tricuspid stenosis. Aortic Valve: The aortic valve is tricuspid. Aortic valve regurgitation is not visualized. No aortic stenosis is present. Aortic valve mean gradient measures 5.4 mmHg. Aortic valve peak gradient measures 11.0 mmHg. Aortic valve area, by VTI measures 2.23  cm. Pulmonic Valve: The pulmonic valve was not well visualized. Pulmonic valve regurgitation is not visualized. No evidence of pulmonic stenosis. Aorta: The aortic root is normal in size and structure. Pulmonary Artery: 26. Venous: The inferior vena cava is normal in size with greater than 50% respiratory variability, suggesting right atrial pressure of 3 mmHg. IAS/Shunts: No atrial level shunt detected by color flow Doppler.  LEFT VENTRICLE PLAX 2D LVIDd:         4.48 cm  Diastology LVIDs:         2.21 cm  LV e' medial:    5.66 cm/s LV PW:         1.07 cm  LV E/e' medial:  17.2 LV IVS:        0.96 cm  LV e' lateral:   5.44 cm/s LVOT diam:  1.90 cm  LV E/e' lateral: 17.9 LV SV:         80 LV SV Index:   45 LVOT Area:     2.84 cm  RIGHT VENTRICLE RV S prime:     24.60 cm/s TAPSE (M-mode): 2.4 cm LEFT ATRIUM           Index       RIGHT ATRIUM           Index LA diam:      3.20 cm 1.81 cm/m  RA Area:     11.00 cm LA Vol (A2C): 56.0 ml 31.64 ml/m RA Volume:   23.10 ml  13.05 ml/m LA Vol (A4C): 43.9 ml 24.81 ml/m  AORTIC VALVE AV Area (Vmax):    2.31 cm AV Area (Vmean):   2.09 cm AV Area (VTI):     2.23 cm AV Vmax:           165.60 cm/s AV Vmean:          111.260 cm/s AV VTI:            0.360 m AV Peak Grad:      11.0 mmHg AV Mean Grad:      5.4 mmHg LVOT Vmax:         134.97 cm/s LVOT Vmean:        82.167 cm/s LVOT VTI:          0.284 m LVOT/AV VTI ratio: 0.79  AORTA Ao Root diam: 2.80 cm MITRAL VALVE                TRICUSPID VALVE MV Area (PHT): 2.93 cm     TR Peak grad:   26.0 mmHg MV Decel Time: 259 msec     TR Vmax:        255.00 cm/s MV E velocity: 97.30 cm/s MV A velocity: 114.00 cm/s  SHUNTS MV E/A ratio:  0.85          Systemic VTI:  0.28 m                             Systemic Diam: 1.90 cm Dina Rich MD Electronically signed by Dina Rich MD Signature Date/Time: 02/27/2020/10:18:54 AM    Final     Impression: Pleasant 74 year old lady admitted to the hospital with chronic, nonbloody diarrhea and associated dehydration.  Clinically, much improved overnight.  Tolerating a regular diet. Etiology of diarrhea not clear at this time (previously negative C. difficile, GIP and celiac screen).?  Medication effect ([place]) Patient needs to be evaluated further for microscopic colitis.  Discussed with Dr. Sherryll Burger earlier this morning.  Colonoscopy planned for December 13.  Work on moving that up and hopefully get it done before Thanksgiving.   Agree with dicyclomine as needed for symptomatic treatment of diarrhea.

## 2020-02-28 NOTE — Progress Notes (Signed)
Being discharged to home. Will be driven home by husband. IV access removed no other lines present. Information packet given with medication orders.

## 2020-02-28 NOTE — Discharge Summary (Signed)
Physician Discharge Summary  Charlotte OpitzKay C Woods ZOX:[id number] DOB: 1945-06-03 DOA: 02/26/2020  PCP: Altamease OilerHarris, Meredith L, FNP  Admit date: 02/26/2020  Discharge date: 02/28/2020  Admitted From:Home  Disposition:  Home  Recommendations for Outpatient Follow-up:  1. Follow up with PCP in 1-2 weeks 2. Continue on Imodium and Bentyl as prescribed for treatment of diarrhea.  May need to consider discontinuation of Elmiron as this may be contributing to her chronic diarrhea. 3. Follow-up with gastroenterology which will be scheduled within the next week for colonoscopy 4. Continue other home medications and avoid Lasix use as noted below  Home Health: None  Equipment/Devices: None  Discharge Condition: Stable  CODE STATUS: Full  Diet recommendation: Heart Healthy  Brief/Interim Summary: Per HPI: Charlotte GrossKay C Collinsis a 74 y.o.femalewith medical history significant fordiverticulosis, hypertension, interstitial cystitis. Patient went to see her primary care provider today and was found to be hypotensive, blood pressure 80/40, heart rate 120s so she was sent to the ED. Patient reports intermittent episodes of dizziness and lightheadedness when standing over the past month. Patient has had diarrhea over the past 2 months. She reports initially she was having at least 5-7 bowel movements every day, watery stools. No blood. She was started on loperamide and this reduced the bowel movements to at least 2 watery stools every day. She is taking loperamide every day and has finished 4 packets she says. She had one episode of vomiting today after she took a lot of Pepto-Bismol,otherwise no episodes of vomiting, no abdominal pain. Patient reports chronicpoororal intake,and significant weight loss over the past3 months.Per charts patient has had 20 pound weight loss since May.  Patient also reports bilateral lower extremity swelling 2 weeks ago, she was started on Lasix 20 mg daily about a week  ago, and this significantly improved her lower extremity swelling. She denies chest pain, no difficulty breathing.  She denies black stools, no blood in stools, no vomiting of blood.  Patient followed up with her gastroenterologist for her diarrhea, she had stool work-up done,andoutpatient colonoscopy planned.  11/5: Patient has been admitted with symptomatic hypotension in the setting of dehydration related to recent Lasix use as well as chronic diarrhea over the last 2 months.  She is also noted to have acute anemia and is noted to be iron deficient for which iron infusion will be administered.  GI following to assist with management of diarrhea.  2D echocardiogram performed with LVEF 65-70% and grade 1 diastolic dysfunction noted.  Her blood pressures are slowly improving with IV fluid administration.  11/6: Patient has no further diarrhea noted and no further overt bleeding.  Hemoglobin levels remained stable at 9 and creatinine has improved to 0.7.  2D echocardiogram performed while inpatient with LVEF 65-70% noted.  Plans are for follow-up with GI in the near future for colonoscopy.  She has been advised to hold off on Lasix use in the meantime and monitor lower extremity edema which appears to be more related to hypoalbuminemia.  She will remain on Bentyl and Imodium as prescribed per GI recommendations and have follow-up scheduled.  No other acute events or concerns noted throughout the duration of this admission.  Discharge Diagnoses:  Principal Problem:   Hypotension Active Problems:   Chronic diarrhea   Acute anemia  Principal discharge diagnosis: Symptomatic hypotension related to dehydration in the setting of chronic diarrhea-with noted AKI.  Discharge Instructions  Discharge Instructions    Diet - low sodium heart healthy   Complete by: As directed  Increase activity slowly   Complete by: As directed      Allergies as of 02/28/2020      Reactions   Codeine Other  (See Comments)   Hallucinations   Penicillins Hives   Phenazopyridine Hives   Pravastatin Other (See Comments)   Body aches   Septra [sulfamethoxazole-trimethoprim] Hives   Simvastatin Other (See Comments)   Body aches   Zetia [ezetimibe] Other (See Comments)   Muscle cramps      Medication List    STOP taking these medications   furosemide 20 MG tablet Commonly known as: LASIX     TAKE these medications   aspirin 81 MG tablet Take 81 mg by mouth daily.   B-12 PO Take 1 tablet by mouth daily.   baclofen 10 MG tablet Commonly known as: LIORESAL Take 10 mg by mouth 2 (two) times daily as needed.   bismuth subsalicylate 262 MG/15ML suspension Commonly known as: PEPTO BISMOL Take 30 mLs by mouth every 6 (six) hours as needed.   diclofenac 75 MG EC tablet Commonly known as: VOLTAREN Take 75 mg by mouth 2 (two) times daily.   dicyclomine 10 MG capsule Commonly known as: Bentyl Take 1 capsule (10 mg total) by mouth 4 (four) times daily -  before meals and at bedtime for 14 days.   Elmiron 100 MG capsule Generic drug: pentosan polysulfate Take 100 mg by mouth 3 (three) times daily. Reported on 05/12/2015   Fish Oil 1200 MG Caps Take 1,200 mg by mouth daily.   GARLIC PO Take 1 tablet by mouth daily.   Ginger Root 550 MG Caps Take 1 capsule by mouth daily.   Glucosamine 500 MG Caps Take 1,000 mg by mouth in the morning and at bedtime.   hydrOXYzine 25 MG tablet Commonly known as: ATARAX/VISTARIL Take 25 mg by mouth daily.   levothyroxine 50 MCG tablet Commonly known as: SYNTHROID Take 1 tablet by mouth daily.   lisinopril-hydrochlorothiazide 20-12.5 MG tablet Commonly known as: ZESTORETIC Take 2 tablets by mouth daily.   loperamide 2 MG capsule Commonly known as: IMODIUM Take 1 capsule (2 mg total) by mouth as needed for diarrhea or loose stools.   multivitamin tablet Take 1 tablet by mouth every other day.   Myrbetriq 25 MG Tb24 tablet Generic  drug: mirabegron ER Take 25 mg by mouth daily.   omeprazole 20 MG capsule Commonly known as: PRILOSEC Take 1 capsule (20 mg total) by mouth daily.   ondansetron 4 MG tablet Commonly known as: ZOFRAN Take 1 tablet (4 mg total) by mouth every 8 (eight) hours as needed for nausea or vomiting.   sertraline 50 MG tablet Commonly known as: ZOLOFT Take 2 tablets by mouth daily.       Follow-up Information    Altamease Oiler, FNP Follow up in 1 week(s).   Specialty: Family Medicine Contact information: [address] [place] [address] [phone]        [facility] Follow up in 1 week(s).   Contact information: [address] [place] [address] [phone]             Allergies  Allergen Reactions  . Codeine Other (See Comments)    Hallucinations   . Penicillins Hives  . Phenazopyridine Hives  . Pravastatin Other (See Comments)    Body aches   . Septra [Sulfamethoxazole-Trimethoprim] Hives  . Simvastatin Other (See Comments)    Body aches   . Zetia [Ezetimibe] Other (  See Comments)    Muscle cramps     Consultations:  GI   Procedures/Studies: DG Chest 2 View  Result Date: 02/26/2020 CLINICAL DATA:  Hypertension EXAM: CHEST - 2 VIEW COMPARISON:  None FINDINGS: Cardiomediastinal contours and hilar structures are normal. Lungs are clear. No sign of effusion. Limited assessment of skeletal structures are is unremarkable. IMPRESSION: No active cardiopulmonary disease. Electronically Signed   By: Donzetta Kohut M.D.   On: 02/26/2020 19:00   CT Abdomen Pelvis W Contrast  Result Date: 02/26/2020 CLINICAL DATA:  Hypotension.  Diverticulitis suspected. EXAM: CT ABDOMEN AND PELVIS WITH CONTRAST TECHNIQUE: Multidetector CT imaging of the abdomen and pelvis was performed using the standard protocol following bolus administration of intravenous contrast. CONTRAST:  70mL OMNIPAQUE IOHEXOL 300 MG/ML  SOLN COMPARISON:   11/11/2019 FINDINGS: Lower chest: No acute abnormality Hepatobiliary: Prior cholecystectomy. Mild biliary ductal dilatation is stable since prior study likely related to post cholecystectomy state. No focal hepatic abnormality. Pancreas: No focal abnormality or ductal dilatation. Spleen: No focal abnormality.  Normal size. Adrenals/Urinary Tract: No adrenal abnormality. No focal renal abnormality. No stones or hydronephrosis. Urinary bladder is unremarkable. Stomach/Bowel: Left colonic diverticulosis. No evidence of diverticulitis. Stomach and small bowel grossly unremarkable. Vascular/Lymphatic: Aortoiliac atherosclerosis. No evidence of aneurysm or adenopathy. Reproductive: Prior hysterectomy.  No adnexal masses. Other: No free fluid or free air. Musculoskeletal: No acute bony abnormality. Diffuse degenerative changes throughout the lumbar spine. IMPRESSION: Left colonic diverticulosis.  No active diverticulitis. Aortic atherosclerosis. No acute findings. Electronically Signed   By: Charlett Nose M.D.   On: 02/26/2020 20:35   ECHOCARDIOGRAM COMPLETE  Result Date: 02/27/2020    ECHOCARDIOGRAM REPORT   Patient Name:   ALEXANDERA KUNTZMAN Date of Exam: 02/27/2020 Medical Rec #:  [id number]     Height:       64.5 in Accession #:    [id number]    Weight:       156.0 lb Date of Birth:  04-14-1946     BSA:          1.770 m Patient Age:    74 years      BP:           95/50 mmHg Patient Gender: F             HR:           69 bpm. [facility] Procedure: 2D Echo Indications:    Postural dizziness with presyncope [[id number]  History:        Patient has no prior history of Echocardiogram examinations.                 Risk Factors:Non-Smoker. GERD, Hypotension.  Sonographer:    Jeryl Columbia RDCS (AE) Referring Phys: [id number] [facility] IMPRESSIONS  1. Left ventricular ejection fraction, by estimation, is 65 to 70%. The left ventricle has normal function. The left ventricle has no regional wall motion  abnormalities. Left ventricular diastolic parameters are consistent with Grade I diastolic dysfunction (impaired relaxation). Elevated left atrial pressure.  2. Right ventricular systolic function is normal. The right ventricular size is normal.  3. The mitral valve is normal in structure. No evidence of mitral valve regurgitation. No evidence of mitral stenosis.  4. The aortic valve is tricuspid. Aortic valve regurgitation is not visualized. No aortic stenosis is present.  5. The inferior vena cava is normal in size with greater than 50% respiratory variability, suggesting right atrial pressure of 3 mmHg. FINDINGS  Left Ventricle: Left ventricular ejection fraction, by estimation, is 65 to 70%. The left ventricle has normal function. The left ventricle has no regional wall motion abnormalities. The left ventricular internal cavity size was normal in size. There is  no left ventricular hypertrophy. Left ventricular diastolic parameters are consistent with Grade I diastolic dysfunction (impaired relaxation). Elevated left atrial pressure. Right Ventricle: The right ventricular size is normal. No increase in right ventricular wall thickness. Right ventricular systolic function is normal. Left Atrium: Left atrial size was normal in size. Right Atrium: Right atrial size was normal in size. Pericardium: There is no evidence of pericardial effusion. Mitral Valve: The mitral valve is normal in structure. No evidence of mitral valve regurgitation. No evidence of mitral valve stenosis. Tricuspid Valve: The tricuspid valve is normal in structure. Tricuspid valve regurgitation is not demonstrated. No evidence of tricuspid stenosis. Aortic Valve: The aortic valve is tricuspid. Aortic valve regurgitation is not visualized. No aortic stenosis is present. Aortic valve mean gradient measures 5.4 mmHg. Aortic valve peak gradient measures 11.0 mmHg. Aortic valve area, by VTI measures 2.23  cm. Pulmonic Valve: The pulmonic valve was  not well visualized. Pulmonic valve regurgitation is not visualized. No evidence of pulmonic stenosis. Aorta: The aortic root is normal in size and structure. Pulmonary Artery: 26. Venous: The inferior vena cava is normal in size with greater than 50% respiratory variability, suggesting right atrial pressure of 3 mmHg. IAS/Shunts: No atrial level shunt detected by color flow Doppler.  LEFT VENTRICLE PLAX 2D LVIDd:         4.48 cm  Diastology LVIDs:         2.21 cm  LV e' medial:    5.66 cm/s LV PW:         1.07 cm  LV E/e' medial:  17.2 LV IVS:        0.96 cm  LV e' lateral:   5.44 cm/s LVOT diam:     1.90 cm  LV E/e' lateral: 17.9 LV SV:         80 LV SV Index:   45 LVOT Area:     2.84 cm  RIGHT VENTRICLE RV S prime:     24.60 cm/s TAPSE (M-mode): 2.4 cm LEFT ATRIUM           Index       RIGHT ATRIUM           Index LA diam:      3.20 cm 1.81 cm/m  RA Area:     11.00 cm LA Vol (A2C): 56.0 ml 31.64 ml/m RA Volume:   23.10 ml  13.05 ml/m LA Vol (A4C): 43.9 ml 24.81 ml/m  AORTIC VALVE AV Area (Vmax):    2.31 cm AV Area (Vmean):   2.09 cm AV Area (VTI):     2.23 cm AV Vmax:           165.60 cm/s AV Vmean:          111.260 cm/s AV VTI:            0.360 m AV Peak Grad:      11.0 mmHg AV Mean Grad:      5.4 mmHg LVOT Vmax:         134.97 cm/s LVOT Vmean:        82.167 cm/s LVOT VTI:          0.284 m LVOT/AV VTI ratio: 0.79  AORTA Ao Root diam: 2.80 cm MITRAL VALVE  TRICUSPID VALVE MV Area (PHT): 2.93 cm     TR Peak grad:   26.0 mmHg MV Decel Time: 259 msec     TR Vmax:        255.00 cm/s MV E velocity: 97.30 cm/s MV A velocity: 114.00 cm/s  SHUNTS MV E/A ratio:  0.85         Systemic VTI:  0.28 m                             Systemic Diam: 1.90 cm Dina Rich MD Electronically signed by Dina Rich MD Signature Date/Time: 02/27/2020/10:18:54 AM    Final      Discharge Exam: Vitals:   02/28/20 0745 02/28/20 0800  BP: (!) 123/52 (!) 131/48  Pulse: 90 77  Resp: 12 11  Temp: 98.9 F  (37.2 C)   SpO2: 96% 99%   Vitals:   02/28/20 0000 02/28/20 0100 02/28/20 0745 02/28/20 0800  BP:   (!) 123/52 (!) 131/48  Pulse:   90 77  Resp: 15 (!) 9 12 11   Temp: 98.6 F (37 C)  98.9 F (37.2 C)   TempSrc: Oral  Oral   SpO2: 94%  96% 99%  Weight:      Height:        General: Pt is alert, awake, not in acute distress Cardiovascular: RRR, S1/S2 +, no rubs, no gallops Respiratory: CTA bilaterally, no wheezing, no rhonchi Abdominal: Soft, NT, ND, bowel sounds + Extremities: no edema, no cyanosis    The results of significant diagnostics from this hospitalization (including imaging, microbiology, ancillary and laboratory) are listed below for reference.     Microbiology: Recent Results (from the past 240 hour(s))  Respiratory Panel by RT PCR (Flu A&B, Covid) - Nasopharyngeal Swab     Status: None   Collection Time: 02/26/20  8:49 PM   Specimen: Nasopharyngeal Swab  Result Value Ref Range Status   SARS Coronavirus 2 by RT PCR NEGATIVE NEGATIVE Final    Comment: (NOTE) SARS-CoV-2 target nucleic acids are NOT DETECTED.  The SARS-CoV-2 RNA is generally detectable in upper respiratoy specimens during the acute phase of infection. The lowest concentration of SARS-CoV-2 viral copies this assay can detect is 131 copies/mL. A negative result does not preclude SARS-Cov-2 infection and should not be used as the sole basis for treatment or other patient management decisions. A negative result may occur with  improper specimen collection/handling, submission of specimen other than nasopharyngeal swab, presence of viral mutation(s) within the areas targeted by this assay, and inadequate number of viral copies (<131 copies/mL). A negative result must be combined with clinical observations, patient history, and epidemiological information. The expected result is Negative.  Fact Sheet for Patients:  13/04/21  Fact Sheet for Healthcare Providers:   https://www.moore.com/  This test is no t yet approved or cleared by the https://www.young.biz/ FDA and  has been authorized for detection and/or diagnosis of SARS-CoV-2 by FDA under an Emergency Use Authorization (EUA). This EUA will remain  in effect (meaning this test can be used) for the duration of the COVID-19 declaration under Section 564(b)(1) of the Act, 21 U.S.C. section 360bbb-3(b)(1), unless the authorization is terminated or revoked sooner.     Influenza A by PCR NEGATIVE NEGATIVE Final   Influenza B by PCR NEGATIVE NEGATIVE Final    Comment: (NOTE) The Xpert Xpress SARS-CoV-2/FLU/RSV assay is intended as an aid in  the diagnosis of influenza from  Nasopharyngeal swab specimens and  should not be used as a sole basis for treatment. Nasal washings and  aspirates are unacceptable for Xpert Xpress SARS-CoV-2/FLU/RSV  testing.  Fact Sheet for Patients: https://www.moore.com/  Fact Sheet for Healthcare Providers: https://www.young.biz/  This test is not yet approved or cleared by the [place] FDA and  has been authorized for detection and/or diagnosis of SARS-CoV-2 by  FDA under an Emergency Use Authorization (EUA). This EUA will remain  in effect (meaning this test can be used) for the duration of the  Covid-19 declaration under Section 564(b)(1) of the Act, 21  U.S.C. section 360bbb-3(b)(1), unless the authorization is  terminated or revoked. Performed at [facility], [address], [place], [place] [address]   MRSA PCR Screening     Status: None   Collection Time: 02/27/20  4:39 PM   Specimen: Nasal Mucosa; Nasopharyngeal  Result Value Ref Range Status   MRSA by PCR NEGATIVE NEGATIVE Final    Comment:        The GeneXpert MRSA Assay (FDA approved for NASAL specimens only), is one component of a comprehensive MRSA colonization surveillance program. It is not intended to diagnose MRSA infection nor to guide  or monitor treatment for MRSA infections. Performed at [facility], [address] [place], [place] [address]      Labs: BNP (last 3 results) No results for input(s): BNP in the last 8760 hours. Basic Metabolic Panel: Recent Labs  Lab 02/26/20 1901 02/27/20 0424 02/28/20 0403  NA 140 142 143  K 3.2* 3.7 4.3  CL 105 109 111  CO2 GLUCOSE 112* 91 92  BUN 27* 21 13  CREATININE 1.37* 1.02* 0.73  CALCIUM 8.7* 8.5* 8.8*  MG 1.7  --  1.4*   Liver Function Tests: Recent Labs  Lab 02/26/20 1901  AST 18  ALT 12  ALKPHOS 19*  BILITOT 0.5  PROT 5.3*  ALBUMIN 2.5*   No results for input(s): LIPASE, AMYLASE in the last 168 hours. No results for input(s): AMMONIA in the last 168 hours. CBC: Recent Labs  Lab 02/26/20 1901 02/27/20 0424 02/28/20 0403  WBC 7.5 6.4 7.0  NEUTROABS 3.3  --   --   HGB 9.4* 9.0* 9.0*  HCT 31.1* 28.9* 29.8*  MCV 84.3 84.8 86.1  PLT 337 306 306   Cardiac Enzymes: No results for input(s): CKTOTAL, CKMB, CKMBINDEX, TROPONINI in the last 168 hours. BNP: Invalid input(s): POCBNP CBG: No results for input(s): GLUCAP in the last 168 hours. D-Dimer No results for input(s): DDIMER in the last 72 hours. Hgb A1c No results for input(s): HGBA1C in the last 72 hours. Lipid Profile No results for input(s): CHOL, HDL, LDLCALC, TRIG, CHOLHDL, LDLDIRECT in the last 72 hours. Thyroid function studies No results for input(s): TSH, T4TOTAL, T3FREE, THYROIDAB in the last 72 hours.  Invalid input(s): FREET3 Anemia work up Recent Labs    02/27/20 0424  VITAMINB12 751  FOLATE 44.5  FERRITIN 135  TIBC 361  IRON 23*  RETICCTPCT 2.4   Urinalysis    Component Value Date/Time   COLORURINE YELLOW 02/26/2020 1900   APPEARANCEUR CLEAR 02/26/2020 1900   LABSPEC 1.009 02/26/2020 1900   PHURINE 6.0 02/26/2020 1900   GLUCOSEU NEGATIVE 02/26/2020 1900   HGBUR NEGATIVE 02/26/2020 1900   BILIRUBINUR NEGATIVE 02/26/2020 1900   KETONESUR NEGATIVE  02/26/2020 1900   PROTEINUR NEGATIVE 02/26/2020 1900   NITRITE NEGATIVE 02/26/2020 1900   LEUKOCYTESUR NEGATIVE 02/26/2020 1900  Sepsis Labs Invalid input(s): PROCALCITONIN,  WBC,  LACTICIDVEN Microbiology Recent Results (from the past 240 hour(s))  Respiratory Panel by RT PCR (Flu A&B, Covid) - Nasopharyngeal Swab     Status: None   Collection Time: 02/26/20  8:49 PM   Specimen: Nasopharyngeal Swab  Result Value Ref Range Status   SARS Coronavirus 2 by RT PCR NEGATIVE NEGATIVE Final    Comment: (NOTE) SARS-CoV-2 target nucleic acids are NOT DETECTED.  The SARS-CoV-2 RNA is generally detectable in upper respiratoy specimens during the acute phase of infection. The lowest concentration of SARS-CoV-2 viral copies this assay can detect is 131 copies/mL. A negative result does not preclude SARS-Cov-2 infection and should not be used as the sole basis for treatment or other patient management decisions. A negative result may occur with  improper specimen collection/handling, submission of specimen other than nasopharyngeal swab, presence of viral mutation(s) within the areas targeted by this assay, and inadequate number of viral copies (<131 copies/mL). A negative result must be combined with clinical observations, patient history, and epidemiological information. The expected result is Negative.  Fact Sheet for Patients:  https://www.moore.com/  Fact Sheet for Healthcare Providers:  https://www.young.biz/  This test is no t yet approved or cleared by the [place] FDA and  has been authorized for detection and/or diagnosis of SARS-CoV-2 by FDA under an Emergency Use Authorization (EUA). This EUA will remain  in effect (meaning this test can be used) for the duration of the COVID-19 declaration under Section 564(b)(1) of the Act, 21 U.S.C. section 360bbb-3(b)(1), unless the authorization is terminated or revoked sooner.     Influenza  A by PCR NEGATIVE NEGATIVE Final   Influenza B by PCR NEGATIVE NEGATIVE Final    Comment: (NOTE) The Xpert Xpress SARS-CoV-2/FLU/RSV assay is intended as an aid in  the diagnosis of influenza from Nasopharyngeal swab specimens and  should not be used as a sole basis for treatment. Nasal washings and  aspirates are unacceptable for Xpert Xpress SARS-CoV-2/FLU/RSV  testing.  Fact Sheet for Patients: https://www.moore.com/  Fact Sheet for Healthcare Providers: https://www.young.biz/  This test is not yet approved or cleared by the [place] FDA and  has been authorized for detection and/or diagnosis of SARS-CoV-2 by  FDA under an Emergency Use Authorization (EUA). This EUA will remain  in effect (meaning this test can be used) for the duration of the  Covid-19 declaration under Section 564(b)(1) of the Act, 21  U.S.C. section 360bbb-3(b)(1), unless the authorization is  terminated or revoked. Performed at [facility], [address], [place], [place] [address]   MRSA PCR Screening     Status: None   Collection Time: 02/27/20  4:39 PM   Specimen: Nasal Mucosa; Nasopharyngeal  Result Value Ref Range Status   MRSA by PCR NEGATIVE NEGATIVE Final    Comment:        The GeneXpert MRSA Assay (FDA approved for NASAL specimens only), is one component of a comprehensive MRSA colonization surveillance program. It is not intended to diagnose MRSA infection nor to guide or monitor treatment for MRSA infections. Performed at [facility], [address] [place], [place] [address]      Time coordinating discharge: 35 minutes  SIGNED:   Erick Blinks, DO [facility] 02/28/2020, 10:44 AM  If 7PM-7AM, please contact night-coverage [email or web address]

## 2020-03-01 NOTE — Telephone Encounter (Signed)
Pre-op/covid test 03/05/20 at 12:30pm.   Called and informed pt of pre-op/covid test. Advised her to come by our office 03/05/20 before 11:45am before office closes to pick up new procedure instructions. Verbalized understanding.  FYI to Ermalinda Memos PA, TCS will be with Dr. Marletta Lor.

## 2020-03-01 NOTE — Telephone Encounter (Signed)
Noted. Glad we were able to get her scheduled quickly.

## 2020-03-01 NOTE — Telephone Encounter (Signed)
Dr. Jena Gauss requested for pt to have TCS prior to Thanksgiving.  Called pt, informed her Dr. Jena Gauss doesn't have any available dates for TCS prior to Thanksgiving. Dr. Marletta Lor has opening 03/08/20. She is ok with having TCS w/Dr. Marletta Lor (propofol ASA 3, ok per Shawna Orleans to add ASA 3 at end of day).  TCS scheduled for 03/08/20 at 3:00pm. Informed she can pick up new instructions at office when she has pre-op/covid test appt.   LMOVM for endo scheduler to cancel previous TCS orders. New orders entered for TCS w/Dr. Marletta Lor.

## 2020-03-03 NOTE — Patient Instructions (Signed)
Charlotte Woods  03/03/2020     @PREFPERIOPPHARMACY @   Your procedure is scheduled on  03/08/2020.  Report to 03/10/2020 at  1300 (1:00)  P.M.  Call this number if you have problems the morning of surgery:  [phone]   Remember:  Follow the diet and prep instructions given to you by the office.                      Take these medicines the morning of surgery with A SIP OF WATER  Baclofen(if needed), elmiron, levothyroxine, prilosec, zoloft.    Do not wear jewelry, make-up or nail polish.  Do not wear lotions, powders, or perfumes. Please wear deodorant and brush your teeth.  Do not shave 48 hours prior to surgery.  Men may shave face and neck.  Do not bring valuables to the hospital.  [facility] is not responsible for any belongings or valuables.  Contacts, dentures or bridgework may not be worn into surgery.  Leave your suitcase in the car.  After surgery it may be brought to your room.  For patients admitted to the hospital, discharge time will be determined by your treatment team.  Patients discharged the day of surgery will not be allowed to drive home.   Name and phone number of your driver:   family Special instructions:  DO NOT smoke the morning of your procedure.  Please read over the following fact sheets that you were given. Anesthesia Post-op Instructions and Care and Recovery After Surgery       Colonoscopy, Adult, Care After This sheet gives you information about how to care for yourself after your procedure. Your health care provider may also give you more specific instructions. If you have problems or questions, contact your health care provider. What can I expect after the procedure? After the procedure, it is common to have:  A small amount of blood in your stool for 24 hours after the procedure.  Some gas.  Mild cramping or bloating of your abdomen. Follow these instructions at home: Eating and drinking   Drink enough fluid to keep  your urine pale yellow.  Follow instructions from your health care provider about eating or drinking restrictions.  Resume your normal diet as instructed by your health care provider. Avoid heavy or fried foods that are hard to digest. Activity  Rest as told by your health care provider.  Avoid sitting for a long time without moving. Get up to take short walks every 1-2 hours. This is important to improve blood flow and breathing. Ask for help if you feel weak or unsteady.  Return to your normal activities as told by your health care provider. Ask your health care provider what activities are safe for you. Managing cramping and bloating   Try walking around when you have cramps or feel bloated.  Apply heat to your abdomen as told by your health care provider. Use the heat source that your health care provider recommends, such as a moist heat pack or a heating pad. ? Place a towel between your skin and the heat source. ? Leave the heat on for 20-30 minutes. ? Remove the heat if your skin turns bright red. This is especially important if you are unable to feel pain, heat, or cold. You may have a greater risk of getting burned. General instructions  For the first 24 hours after the procedure: ? Do not drive or use  machinery. ? Do not sign important documents. ? Do not drink alcohol. ? Do your regular daily activities at a slower pace than normal. ? Eat soft foods that are easy to digest.  Take over-the-counter and prescription medicines only as told by your health care provider.  Keep all follow-up visits as told by your health care provider. This is important. Contact a health care provider if:  You have blood in your stool 2-3 days after the procedure. Get help right away if you have:  More than a small spotting of blood in your stool.  Large blood clots in your stool.  Swelling of your abdomen.  Nausea or vomiting.  A fever.  Increasing pain in your abdomen that is not  relieved with medicine. Summary  After the procedure, it is common to have a small amount of blood in your stool. You may also have mild cramping and bloating of your abdomen.  For the first 24 hours after the procedure, do not drive or use machinery, sign important documents, or drink alcohol.  Get help right away if you have a lot of blood in your stool, nausea or vomiting, a fever, or increased pain in your abdomen. This information is not intended to replace advice given to you by your health care provider. Make sure you discuss any questions you have with your health care provider. Document Revised: 11/04/2018 Document Reviewed: 11/04/2018 Elsevier Patient Education  [place] After These instructions provide you with information about caring for yourself after your procedure. Your health care provider may also give you more specific instructions. Your treatment has been planned according to current medical practices, but problems sometimes occur. Call your health care provider if you have any problems or questions after your procedure. What can I expect after the procedure? After your procedure, you may:  Feel sleepy for several hours.  Feel clumsy and have poor balance for several hours.  Feel forgetful about what happened after the procedure.  Have poor judgment for several hours.  Feel nauseous or vomit.  Have a sore throat if you had a breathing tube during the procedure. Follow these instructions at home: For at least 24 hours after the procedure:      Have a responsible adult stay with you. It is important to have someone help care for you until you are awake and alert.  Rest as needed.  Do not: ? Participate in activities in which you could fall or become injured. ? Drive. ? Use heavy machinery. ? Drink alcohol. ? Take sleeping pills or medicines that cause drowsiness. ? Make important decisions or sign legal  documents. ? Take care of children on your own. Eating and drinking  Follow the diet that is recommended by your health care provider.  If you vomit, drink water, juice, or soup when you can drink without vomiting.  Make sure you have little or no nausea before eating solid foods. General instructions  Take over-the-counter and prescription medicines only as told by your health care provider.  If you have sleep apnea, surgery and certain medicines can increase your risk for breathing problems. Follow instructions from your health care provider about wearing your sleep device: ? Anytime you are sleeping, including during daytime naps. ? While taking prescription pain medicines, sleeping medicines, or medicines that make you drowsy.  If you smoke, do not smoke without supervision.  Keep all follow-up visits as told by your health care provider. This is important. Contact  a health care provider if:  You keep feeling nauseous or you keep vomiting.  You feel light-headed.  You develop a rash.  You have a fever. Get help right away if:  You have trouble breathing. Summary  For several hours after your procedure, you may feel sleepy and have poor judgment.  Have a responsible adult stay with you for at least 24 hours or until you are awake and alert. This information is not intended to replace advice given to you by your health care provider. Make sure you discuss any questions you have with your health care provider. Document Revised: 07/09/2017 Document Reviewed: 08/01/2015 Elsevier Patient Education  [place].

## 2020-03-05 ENCOUNTER — Encounter (HOSPITAL_COMMUNITY)
Admission: RE | Admit: 2020-03-05 | Discharge: 2020-03-05 | Disposition: A | Discharge status: Home / Self Care | Payer: Medicare Other | Source: Ambulatory Visit | Attending: Internal Medicine | Admitting: Internal Medicine

## 2020-03-05 ENCOUNTER — Other Ambulatory Visit (HOSPITAL_COMMUNITY)
Admission: RE | Admit: 2020-03-05 | Discharge: 2020-03-05 | Disposition: A | Discharge status: Home / Self Care | Payer: Medicare Other | Source: Ambulatory Visit | Attending: Internal Medicine | Admitting: Internal Medicine

## 2020-03-05 ENCOUNTER — Other Ambulatory Visit: Payer: Self-pay

## 2020-03-05 DIAGNOSIS — Z20822 Contact with and (suspected) exposure to covid-19: Secondary | ICD-10-CM | POA: Diagnosis not present

## 2020-03-05 DIAGNOSIS — Z01812 Encounter for preprocedural laboratory examination: Secondary | ICD-10-CM | POA: Diagnosis present

## 2020-03-05 LAB — SARS CORONAVIRUS 2 (TAT 6-24 HRS): SARS Coronavirus 2: NEGATIVE

## 2020-03-08 ENCOUNTER — Ambulatory Visit (HOSPITAL_COMMUNITY): Payer: Medicare Other | Admitting: Anesthesiology

## 2020-03-08 ENCOUNTER — Ambulatory Visit (HOSPITAL_COMMUNITY)
Admission: RE | Admit: 2020-03-08 | Discharge: 2020-03-08 | Disposition: A | Discharge status: Home / Self Care | Payer: Medicare Other | Attending: Internal Medicine | Admitting: Internal Medicine

## 2020-03-08 ENCOUNTER — Encounter (HOSPITAL_COMMUNITY)
Admission: RE | Disposition: A | Discharge status: Home / Self Care | Payer: Self-pay | Source: Home / Self Care | Attending: Internal Medicine

## 2020-03-08 ENCOUNTER — Other Ambulatory Visit: Payer: Self-pay

## 2020-03-08 ENCOUNTER — Encounter (HOSPITAL_COMMUNITY): Payer: Self-pay

## 2020-03-08 DIAGNOSIS — K648 Other hemorrhoids: Secondary | ICD-10-CM | POA: Diagnosis not present

## 2020-03-08 DIAGNOSIS — R634 Abnormal weight loss: Secondary | ICD-10-CM | POA: Diagnosis not present

## 2020-03-08 DIAGNOSIS — Z885 Allergy status to narcotic agent status: Secondary | ICD-10-CM | POA: Diagnosis not present

## 2020-03-08 DIAGNOSIS — Z7982 Long term (current) use of aspirin: Secondary | ICD-10-CM | POA: Diagnosis not present

## 2020-03-08 DIAGNOSIS — Z8049 Family history of malignant neoplasm of other genital organs: Secondary | ICD-10-CM | POA: Insufficient documentation

## 2020-03-08 DIAGNOSIS — Z79890 Hormone replacement therapy (postmenopausal): Secondary | ICD-10-CM | POA: Diagnosis not present

## 2020-03-08 DIAGNOSIS — K5730 Diverticulosis of large intestine without perforation or abscess without bleeding: Secondary | ICD-10-CM | POA: Insufficient documentation

## 2020-03-08 DIAGNOSIS — Z801 Family history of malignant neoplasm of trachea, bronchus and lung: Secondary | ICD-10-CM | POA: Diagnosis not present

## 2020-03-08 DIAGNOSIS — K559 Vascular disorder of intestine, unspecified: Secondary | ICD-10-CM | POA: Diagnosis not present

## 2020-03-08 DIAGNOSIS — Z79899 Other long term (current) drug therapy: Secondary | ICD-10-CM | POA: Diagnosis not present

## 2020-03-08 DIAGNOSIS — K529 Noninfective gastroenteritis and colitis, unspecified: Secondary | ICD-10-CM | POA: Diagnosis present

## 2020-03-08 DIAGNOSIS — Z888 Allergy status to other drugs, medicaments and biological substances status: Secondary | ICD-10-CM | POA: Insufficient documentation

## 2020-03-08 HISTORY — PX: COLONOSCOPY WITH PROPOFOL: SHX5780

## 2020-03-08 HISTORY — PX: BIOPSY: SHX5522

## 2020-03-08 SURGERY — COLONOSCOPY WITH PROPOFOL
Anesthesia: General

## 2020-03-08 MED ORDER — PROPOFOL 500 MG/50ML IV EMUL
INTRAVENOUS | Status: DC | PRN
  Administered 2020-03-08: 150 ug/kg/min via INTRAVENOUS

## 2020-03-08 MED ORDER — PROPOFOL 10 MG/ML IV BOLUS
INTRAVENOUS | Status: DC | PRN
  Administered 2020-03-08: 50 mg via INTRAVENOUS
  Administered 2020-03-08: 20 mg via INTRAVENOUS

## 2020-03-08 MED ORDER — STERILE WATER FOR IRRIGATION IR SOLN
Status: DC | PRN
  Administered 2020-03-08: 1.5 mL

## 2020-03-08 MED ORDER — LACTATED RINGERS IV SOLN: INTRAVENOUS | Status: DC | PRN

## 2020-03-08 MED ORDER — LACTATED RINGERS IV SOLN: Freq: Once | INTRAVENOUS | Status: AC

## 2020-03-08 NOTE — Transfer of Care (Signed)
Immediate Anesthesia Transfer of Care Note  Patient: Charlotte Woods  Procedure(s) Performed: COLONOSCOPY WITH PROPOFOL (N/A ) BIOPSY  Patient Location: PACU  Anesthesia Type:General  Level of Consciousness: awake  Airway & Oxygen Therapy: Patient Spontanous Breathing  Post-op Assessment: Report given to RN  Post vital signs: Reviewed and stable  Last Vitals:  Vitals Value Taken Time  BP 108/39 03/08/20 1510  Temp    Pulse 66 03/08/20 1513  Resp 16 03/08/20 1513  SpO2 98 % 03/08/20 1513  Vitals shown include unvalidated device data.  Last Pain:  Vitals:   03/08/20 1448  TempSrc:   PainSc: 0-No pain      Patients Stated Pain Goal: 5 (03/08/20 1307)  Complications: No complications documented.

## 2020-03-08 NOTE — Discharge Instructions (Signed)
  Colonoscopy Discharge Instructions  Read the instructions outlined below and refer to this sheet in the next few weeks. These discharge instructions provide you with general information on caring for yourself after you leave the hospital. Your doctor may also give you specific instructions. While your treatment has been planned according to the most current medical practices available, unavoidable complications occasionally occur.   ACTIVITY  You may resume your regular activity, but move at a slower pace for the next 24 hours.   Take frequent rest periods for the next 24 hours.   Walking will help get rid of the air and reduce the bloated feeling in your belly (abdomen).   No driving for 24 hours (because of the medicine (anesthesia) used during the test).    Do not sign any important legal documents or operate any machinery for 24 hours (because of the anesthesia used during the test).  NUTRITION  Drink plenty of fluids.   You may resume your normal diet as instructed by your doctor.   Begin with a light meal and progress to your normal diet. Heavy or fried foods are harder to digest and may make you feel sick to your stomach (nauseated).   Avoid alcoholic beverages for 24 hours or as instructed.  MEDICATIONS  You may resume your normal medications unless your doctor tells you otherwise.  WHAT YOU CAN EXPECT TODAY  Some feelings of bloating in the abdomen.   Passage of more gas than usual.   Spotting of blood in your stool or on the toilet paper.  IF YOU HAD POLYPS REMOVED DURING THE COLONOSCOPY:  No aspirin products for 7 days or as instructed.   No alcohol for 7 days or as instructed.   Eat a soft diet for the next 24 hours.  FINDING OUT THE RESULTS OF YOUR TEST Not all test results are available during your visit. If your test results are not back during the visit, make an appointment with your caregiver to find out the results. Do not assume everything is normal if  you have not heard from your caregiver or the medical facility. It is important for you to follow up on all of your test results.  SEEK IMMEDIATE MEDICAL ATTENTION IF:  You have more than a spotting of blood in your stool.   Your belly is swollen (abdominal distention).   You are nauseated or vomiting.   You have a temperature over 101.   You have abdominal pain or discomfort that is severe or gets worse throughout the day.   Your colonoscopy showed a lot of inflammation on the right side of your colon.  Differential includes ischemic colitis, inflammatory bowel disease, NSAID induced colitis.  I took biopsies of the area.  Await pathology results.  My office will contact you.  Hold diclofenac for now.  Further recommendations to follow.  I hope you have a great rest of your week!  Hennie Duos. Marletta Lor, D.O. Gastroenterology and Hepatology [facility]

## 2020-03-08 NOTE — Anesthesia Preprocedure Evaluation (Addendum)
Anesthesia Evaluation  Patient identified by MRN, date of birth, ID band Patient awake    Reviewed: Allergy & Precautions, NPO status , Patient's Chart, lab work & pertinent test results  History of Anesthesia Complications (+) PONV and history of anesthetic complications  Airway Mallampati: III  TM Distance: >3 FB Neck ROM: Full    Dental  (+) Dental Advisory Given, Teeth Intact   Pulmonary neg pulmonary ROS,    Pulmonary exam normal breath sounds clear to auscultation       Cardiovascular hypertension, Pt. on medications + Past MI  Normal cardiovascular exam Rhythm:Regular Rate:Normal     Neuro/Psych Anxiety negative neurological ROS  negative psych ROS   GI/Hepatic Neg liver ROS, GERD  Medicated,  Endo/Other  Hypothyroidism   Renal/GU negative Renal ROS     Musculoskeletal  (+) Arthritis ,   Abdominal   Peds  Hematology  (+) anemia ,   Anesthesia Other Findings   Reproductive/Obstetrics negative OB ROS                            Anesthesia Physical Anesthesia Plan  ASA: III  Anesthesia Plan: General   Post-op Pain Management:    Induction:   PONV Risk Score and Plan: TIVA  Airway Management Planned: Nasal Cannula, Natural Airway and Simple Face Mask  Additional Equipment:   Intra-op Plan:   Post-operative Plan:   Informed Consent: I have reviewed the patients History and Physical, chart, labs and discussed the procedure including the risks, benefits and alternatives for the proposed anesthesia with the patient or authorized representative who has indicated his/her understanding and acceptance.     Dental advisory given  Plan Discussed with: CRNA and Surgeon  Anesthesia Plan Comments:        Anesthesia Quick Evaluation

## 2020-03-08 NOTE — Anesthesia Postprocedure Evaluation (Signed)
Anesthesia Post Note  Patient: Charlotte Woods  Procedure(s) Performed: COLONOSCOPY WITH PROPOFOL (N/A ) BIOPSY  Patient location during evaluation: PACU Anesthesia Type: General Level of consciousness: awake and alert and oriented Vital Signs Assessment: post-procedure vital signs reviewed and stable Respiratory status: spontaneous breathing Cardiovascular status: blood pressure returned to baseline and stable Postop Assessment: no apparent nausea or vomiting Anesthetic complications: no   No complications documented.   Last Vitals:  Vitals:   03/08/20 1307 03/08/20 1510  BP: (!) 158/70 (!) 108/39  Pulse: 75 69  Resp: 15 15  Temp: 36.7 C (P) 36.7 C  SpO2: 98% 97%    Last Pain:  Vitals:   03/08/20 1448  TempSrc:   PainSc: 0-No pain                 Lashawnna Lambrecht

## 2020-03-08 NOTE — Op Note (Signed)
[facility] Patient Name: Charlotte Woods Procedure Date: 03/08/2020 2:02 PM MRN: [id number] Date of Birth: March 19, 1946 Attending MD: Elon Alas. Edgar Frisk CSN: [id number] Age: 74 Admit Type: Outpatient Procedure:                Colonoscopy Indications:              Chronic diarrhea, Weight loss Providers:                Elon Alas. Abbey Chatters, DO, Charlsie Quest. Theda Sers RN, RN,                            Lambert Mody, Randa Spike, Technician Referring MD:              Medicines:                See the Anesthesia note for documentation of the                            administered medications Complications:            No immediate complications. Estimated Blood Loss:     Estimated blood loss was minimal. Procedure:                Pre-Anesthesia Assessment:                           - The anesthesia plan was to use monitored                            anesthesia care (MAC).                           After obtaining informed consent, the colonoscope                            was passed under direct vision. Throughout the                            procedure, the patient's blood pressure, pulse, and                            oxygen saturations were monitored continuously. The                            [id number]-[id number] ([id number]) scope was introduced through                            the anus and advanced to the the cecum, identified                            by appendiceal orifice and ileocecal valve. The                            colonoscopy was performed without difficulty. The                            patient tolerated the  procedure well. The quality                            of the bowel preparation was evaluated using the                            BBPS [facility] Bowel Preparation Scale) with scores                            of: Right Colon = 3, Transverse Colon = 3 and Left                            Colon = 3 (entire mucosa seen well with no residual                             staining, small fragments of stool or opaque                            liquid). The total BBPS score equals 9. Scope In: 2:53:04 PM Scope Out: 3:05:32 PM Scope Withdrawal Time: 0 hours 8 minutes 52 seconds  Total Procedure Duration: 0 hours 12 minutes 28 seconds  Findings:      The perianal and digital rectal examinations were normal.      Non-bleeding internal hemorrhoids were found during endoscopy.      Many small and large-mouthed diverticula were found in the sigmoid       colon, descending colon and transverse colon.      Patchy severe inflammation characterized by erosions, erythema, mucus       and shallow ulcerations was found in the ascending colon and in the       cecum. Biopsies were taken with a cold forceps for histology. Impression:               - Non-bleeding internal hemorrhoids.                           - Diverticulosis in the sigmoid colon, in the                            descending colon and in the transverse colon.                           - Patchy severe inflammation was found in the                            ascending colon and in the cecum secondary to                            ischemic colitis. Biopsied. Moderate Sedation:      Per Anesthesia Care Recommendation:           - Patient has a contact number available for                            emergencies. The signs and symptoms of potential  delayed complications were discussed with the                            patient. Return to normal activities tomorrow.                            Written discharge instructions were provided to the                            patient.                           - Resume previous diet.                           - Continue present medications.                           - Await pathology results.                           - Repeat colonoscopy at appointment to be scheduled                            for surveillance based on pathology  results.                           - Return to GI clinic at the next available                            appointment.                           - Patient will likely need CTA abdomen/pelvis                            pending biopsy results. Procedure Code(s):        --- Professional ---                           [phone], Colonoscopy, flexible; with biopsy, single                            or multiple Diagnosis Code(s):        --- Professional ---                           K64.8, Other hemorrhoids                           K55.9, Vascular disorder of intestine, unspecified                           K52.9, Noninfective gastroenteritis and colitis,                            unspecified  R63.4, Abnormal weight loss                           K57.30, Diverticulosis of large intestine without                            perforation or abscess without bleeding CPT copyright 2019 American Medical Association. All rights reserved. The codes documented in this report are preliminary and upon coder review may  be revised to meet current compliance requirements. Elon Alas. Abbey Chatters, DO [place] Abbey Chatters, DO 03/08/2020 3:16:59 PM This report has been signed electronically. Number of Addenda: 0

## 2020-03-11 LAB — SURGICAL PATHOLOGY

## 2020-03-22 ENCOUNTER — Encounter: Payer: Self-pay | Admitting: Internal Medicine

## 2020-03-24 ENCOUNTER — Encounter (HOSPITAL_COMMUNITY): Payer: Self-pay | Admitting: Internal Medicine

## 2020-03-24 NOTE — H&P (Signed)
Primary Care Physician:  Altamease Oiler, FNP Primary Gastroenterologist:  Dr. Marletta Lor  Pre-Procedure History & Physical: HPI:  Charlotte Woods is a 74 y.o. female is here  for a colonoscopy to be performed for diarrhea and weight loss. States it has improved since stopping NSAIDs and PPI.  Patient denies any family history of colorectal cancer.  No melena or hematochezia.   Past Medical History:  Diagnosis Date  . Anxiety   . Arthritis   . GERD (gastroesophageal reflux disease)   . Heart attack [facility]) 2010   [facility] [address], silent  . HTN (hypertension)   . Interstitial cystitis   . PONV (postoperative nausea and vomiting)     Past Surgical History:  Procedure Laterality Date  . APPENDECTOMY    . carpel tunnel    . CHOLECYSTECTOMY    . COLONOSCOPY  10/2013   Dr. Aleene Davidson: sigmoid diverticulosis  . COLONOSCOPY N/A 05/19/2015   Procedure: COLONOSCOPY;  Surgeon: Gerrit Friends Rourk, internal hemorrhoids, colonic diverticulosis.  Suspected patient experienced hemorrhoidal or diverticular bleed.  Recommended repeat colonoscopy in 10 years.  . ESOPHAGOGASTRODUODENOSCOPY N/A 05/19/2015   Procedure: ESOPHAGOGASTRODUODENOSCOPY (EGD);  Surgeon: Corbin Ade, MD; normal exam.  . ESOPHAGOGASTRODUODENOSCOPY (EGD) WITH PROPOFOL N/A 08/25/2019   Procedure: ESOPHAGOGASTRODUODENOSCOPY (EGD) WITH PROPOFOL;  Surgeon: Corbin Ade, MD;  Normal examined esophagus s/p dilation, normal stomach, normal examined duodenum.  . hysterectomy  2009   complete. endometrial cancer  . MALONEY DILATION N/A 08/25/2019   Procedure: Elease Hashimoto DILATION;  Surgeon: Corbin Ade, MD;  [facility];  Service: Endoscopy;  Laterality: N/A;  . TONSILLECTOMY      Prior to Admission medications   Medication Sig Start Date End Date Taking? Authorizing Provider  aspirin 81 MG tablet Take 81 mg by mouth daily.    [provider]  baclofen (LIORESAL) 10 MG tablet Take 10 mg by mouth 2 (two) times daily as  needed.     [provider]  bismuth subsalicylate (PEPTO BISMOL) 262 MG/15ML suspension Take 30 mLs by mouth every 6 (six) hours as needed.    [provider]  Cyanocobalamin (B-12 PO) Take 1 tablet by mouth daily.     [provider]  dicyclomine (BENTYL) 10 MG capsule Take 1 capsule (10 mg total) by mouth 4 (four) times daily -  before meals and at bedtime for 14 days. 02/28/20 03/13/20  Sherryll Burger, Pratik D, DO  ELMIRON 100 MG capsule Take 100 mg by mouth 3 (three) times daily. Reported on 05/12/2015 03/25/15   [provider]  GARLIC PO Take 1 tablet by mouth daily.     [provider]  Ginger, Zingiber officinalis, (GINGER ROOT) 550 MG CAPS Take 1 capsule by mouth daily.     [provider]  Glucosamine 500 MG CAPS Take 1,000 mg by mouth in the morning and at bedtime.     [provider]  hydrOXYzine (ATARAX/VISTARIL) 25 MG tablet Take 25 mg by mouth daily.  02/03/15   [provider]  levothyroxine (SYNTHROID, LEVOTHROID) 50 MCG tablet Take 1 tablet by mouth daily. 11/06/17   [provider]  lisinopril-hydrochlorothiazide (ZESTORETIC) 20-12.5 MG tablet Take 2 tablets by mouth daily.  04/15/15   [provider]  loperamide (IMODIUM) 2 MG capsule Take 1 capsule (2 mg total) by mouth as needed for diarrhea or loose stools. 02/28/20   Sherryll Burger, Pratik D, DO  Multiple Vitamin (MULTIVITAMIN) tablet Take 1 tablet by mouth every other day.  [provider]  MYRBETRIQ 25 MG TB24 tablet Take 25 mg by mouth daily. 11/10/17   [provider]  Omega-3 Fatty Acids (FISH OIL) 1200 MG CAPS Take 1,200 mg by mouth daily.     [provider]  omeprazole (PRILOSEC) 20 MG capsule Take 1 capsule (20 mg total) by mouth daily. 11/03/19   Letta Median, PA-C  ondansetron (ZOFRAN) 4 MG tablet Take 1 tablet (4 mg total) by mouth every 8 (eight) hours as needed for nausea or vomiting. 11/03/19   Letta Median,  PA-C  sertraline (ZOLOFT) 50 MG tablet Take 2 tablets by mouth daily.  09/18/17   [provider]    Allergies as of 03/01/2020 - Review Complete 02/26/2020  Allergen Reaction Noted  . Codeine Other (See Comments) 04/29/2015  . Penicillins Hives 04/29/2015  . Phenazopyridine Hives 04/29/2015  . Pravastatin Other (See Comments) 04/29/2015  . Septra [sulfamethoxazole-trimethoprim] Hives 04/29/2015  . Simvastatin Other (See Comments) 04/29/2015  . Zetia [ezetimibe] Other (See Comments) 04/29/2015    Family History  Problem Relation Age of Onset  . Endometrial cancer Mother        died old age, 75  . Lung cancer Father   . Colon cancer Neg Hx   . Stomach cancer Neg Hx   . Pancreatic cancer Neg Hx   . Inflammatory bowel disease Neg Hx     Social History   Socioeconomic History  . Marital status: Divorced    Spouse name: Not on file  . Number of children: 2  . Years of education: Not on file  . Highest education level: Not on file  Occupational History  . Not on file  Tobacco Use  . Smoking status: Never Smoker  . Smokeless tobacco: Never Used  Vaping Use  . Vaping Use: Never used  Substance and Sexual Activity  . Alcohol use: No    Alcohol/week: 0.0 standard drinks  . Drug use: No  . Sexual activity: Not Currently    Birth control/protection: Surgical  Other Topics Concern  . Not on file  Social History Narrative  . Not on file   Social Determinants of Health   Financial Resource Strain: Low Risk   . Difficulty of Paying Living Expenses: Not very hard  Food Insecurity: No Food Insecurity  . Worried About Programme researcher, broadcasting/film/video in the Last Year: Never true  . Ran Out of Food in the Last Year: Never true  Transportation Needs: No Transportation Needs  . Lack of Transportation (Medical): No  . Lack of Transportation (Non-Medical): No  Physical Activity: Insufficiently Active  . Days of Exercise per Week: 4 days  . Minutes of Exercise per Session: 20 min   Stress: No Stress Concern Present  . Feeling of Stress : Not at all  Social Connections: Moderately Isolated  . Frequency of Communication with Friends and Family: More than three times a week  . Frequency of Social Gatherings with Friends and Family: More than three times a week  . Attends Religious Services: Never  . Active Member of Clubs or Organizations: No  . Attends Banker Meetings: Never  . Marital Status: Married  Catering manager Violence: Not At Risk  . Fear of Current or Ex-Partner: No  . Emotionally Abused: No  . Physically Abused: No  . Sexually Abused: No    Review of Systems: See HPI, otherwise negative ROS  Impression/Plan: Charlotte Woods is here for a colonoscopy to be performed for  diarrhea and weight loss.  The risks of the procedure including infection, bleed, or perforation as well as benefits, limitations, alternatives and imponderables have been reviewed with the patient. Questions have been answered. All parties agreeable.

## 2020-04-02 ENCOUNTER — Other Ambulatory Visit (HOSPITAL_COMMUNITY): Payer: Medicare Other

## 2020-04-05 ENCOUNTER — Encounter (HOSPITAL_COMMUNITY): Payer: Self-pay

## 2020-04-05 ENCOUNTER — Ambulatory Visit (HOSPITAL_COMMUNITY): Admit: 2020-04-05 | Payer: Medicare Other | Admitting: Internal Medicine

## 2020-04-05 SURGERY — COLONOSCOPY WITH PROPOFOL
Anesthesia: Monitor Anesthesia Care

## 2020-06-10 ENCOUNTER — Other Ambulatory Visit: Payer: Self-pay

## 2020-06-10 ENCOUNTER — Ambulatory Visit (INDEPENDENT_AMBULATORY_CARE_PROVIDER_SITE_OTHER): Payer: Medicare Other | Admitting: Gastroenterology

## 2020-06-10 ENCOUNTER — Encounter: Payer: Self-pay | Admitting: Gastroenterology

## 2020-06-10 VITALS — BP 132/72 | HR 63 | Temp 97.1°F | Ht 65.0 in | Wt 153.6 lb

## 2020-06-10 DIAGNOSIS — K219 Gastro-esophageal reflux disease without esophagitis: Secondary | ICD-10-CM

## 2020-06-10 DIAGNOSIS — R634 Abnormal weight loss: Secondary | ICD-10-CM | POA: Diagnosis not present

## 2020-06-10 MED ORDER — PANTOPRAZOLE SODIUM 40 MG PO TBEC: 40.0000 mg | DELAYED_RELEASE_TABLET | Freq: Every day | ORAL | 3 refills | Status: DC

## 2020-06-10 NOTE — Patient Instructions (Signed)
I have sent in Protonix to take once each morning, 30 minutes before breakfast. This is best absorbed on an empty stomach. You can take this as needed if that works best instead of every day, especially if you find you don't need it every day.  We will see you in 3 months to make sure your weight is stable!  It was a pleasure to see you today. I want to create trusting relationships with patients to provide genuine, compassionate, and quality care. I value your feedback. If you receive a survey regarding your visit,  I greatly appreciate you taking time to fill this out.   Gelene Mink, PhD, ANP-BC [facility]

## 2020-06-10 NOTE — Progress Notes (Signed)
Referring Provider: Altamease Oiler, FNP Primary Care Physician:  Altamease Oiler, FNP Primary GI: Dr. Jena Gauss    Chief Complaint  Patient presents with  . Gastroesophageal Reflux    Stopped omep bc it was causing diarrhea, stopped x November. Tried pepcid OTC and did fine but it was expensive for a week supply. Not taking anything right now    HPI:   Charlotte Woods is a 75 y.o. female presenting today with a history of GERD, last EGD May 2021 with normal exam s/p esophageal dilation, last office visit with diarrhea and undergoing colonoscopy with  non-bleeding internal hemorrhoids. Sigmoid, descending, and transverse colon diverticulosis. Patchy severe inflammation found in ascending colon and cecum secondary to ?ischemic colitis. Path with ulceration with inflamed granulation tissue. Likely NSAID related.    Stopped omeprazole as this was causing diarrhea. Watching her diet. Having mild flares of GERD currently. No PPI currently.    Weight loss, states it started with diarrhea. Weight now staying in the 150 range for several months.   Normocytic anemia with iron low but normal ferritin. EGD on file from May 2021 that was normal.   Past Medical History:  Diagnosis Date  . Anxiety   . Arthritis   . GERD (gastroesophageal reflux disease)   . Heart attack [facility]) 2010   [facility] [address], silent  . HTN (hypertension)   . Interstitial cystitis   . PONV (postoperative nausea and vomiting)     Past Surgical History:  Procedure Laterality Date  . APPENDECTOMY    . BIOPSY  03/08/2020   Procedure: BIOPSY;  Surgeon: Lanelle Bal, DO;  [facility];  Service: Endoscopy;;  . carpel tunnel    . CHOLECYSTECTOMY    . COLONOSCOPY  10/2013   Dr. Aleene Davidson: sigmoid diverticulosis  . COLONOSCOPY N/A 05/19/2015   Procedure: COLONOSCOPY;  Surgeon: Gerrit Friends Rourk, internal hemorrhoids, colonic diverticulosis.  Suspected patient experienced hemorrhoidal or diverticular  bleed.  Recommended repeat colonoscopy in 10 years.  . COLONOSCOPY WITH PROPOFOL N/A 03/08/2020   non-bleeding internal hemorrhoids. Sigmoid, descending, and transverse colon diverticulosis. Patchy severe inflammation found in ascending colon and cecum secondary to ?ischemic colitis. Path with ulceration with inflamed granulation tissue. Likely NSAID related.   . ESOPHAGOGASTRODUODENOSCOPY N/A 05/19/2015   Procedure: ESOPHAGOGASTRODUODENOSCOPY (EGD);  Surgeon: Corbin Ade, MD; normal exam.  . ESOPHAGOGASTRODUODENOSCOPY (EGD) WITH PROPOFOL N/A 08/25/2019   normal exam s/p esophageal dilation  . hysterectomy  2009   complete. endometrial cancer  . MALONEY DILATION N/A 08/25/2019   Procedure: Elease Hashimoto DILATION;  Surgeon: Corbin Ade, MD;  [facility];  Service: Endoscopy;  Laterality: N/A;  . TONSILLECTOMY      Current Outpatient Medications  Medication Sig Dispense Refill  . aspirin 81 MG tablet Take 81 mg by mouth daily.    Marland Kitchen bismuth subsalicylate (PEPTO BISMOL) 262 MG/15ML suspension Take 30 mLs by mouth every 6 (six) hours as needed.    . Cyanocobalamin (B-12 PO) Take 1 tablet by mouth daily.     Marland Kitchen ELMIRON 100 MG capsule Take 100 mg by mouth 3 (three) times daily. Reported on 05/12/2015    . GARLIC PO Take 1 tablet by mouth daily.     . Ginger, Zingiber officinalis, (GINGER ROOT) 550 MG CAPS Take 1 capsule by mouth daily.     . Glucosamine 500 MG CAPS Take 1,000 mg by mouth in the morning and at bedtime.     Marland Kitchen  hydrOXYzine (ATARAX/VISTARIL) 25 MG tablet Take 25 mg by mouth daily.     Marland Kitchen levothyroxine (SYNTHROID, LEVOTHROID) 50 MCG tablet Take 1 tablet by mouth daily.  0  . lisinopril-hydrochlorothiazide (ZESTORETIC) 20-12.5 MG tablet Take 2 tablets by mouth daily.     Marland Kitchen loperamide (IMODIUM) 2 MG capsule Take 1 capsule (2 mg total) by mouth as needed for diarrhea or loose stools. 30 capsule 2  . Multiple Vitamin (MULTIVITAMIN) tablet Take 1 tablet by mouth every other day.    Marland Kitchen  MYRBETRIQ 25 MG TB24 tablet Take 25 mg by mouth daily.  2  . Omega-3 Fatty Acids (FISH OIL) 1200 MG CAPS Take 1,200 mg by mouth daily.     . ondansetron (ZOFRAN) 4 MG tablet Take 1 tablet (4 mg total) by mouth every 8 (eight) hours as needed for nausea or vomiting. 30 tablet 2  . pantoprazole (PROTONIX) 40 MG tablet Take 1 tablet (40 mg total) by mouth daily. 90 tablet 3  . sertraline (ZOLOFT) 50 MG tablet Take 2 tablets by mouth daily.     Marland Kitchen dicyclomine (BENTYL) 10 MG capsule Take 1 capsule (10 mg total) by mouth 4 (four) times daily -  before meals and at bedtime for 14 days. (Patient not taking: Reported on 06/10/2020) 56 capsule 0   No current facility-administered medications for this visit.    Allergies as of 06/10/2020 - Review Complete 06/10/2020  Allergen Reaction Noted  . Codeine Other (See Comments) 04/29/2015  . Penicillins Hives 04/29/2015  . Phenazopyridine Hives 04/29/2015  . Pravastatin Other (See Comments) 04/29/2015  . Septra [sulfamethoxazole-trimethoprim] Hives 04/29/2015  . Simvastatin Other (See Comments) 04/29/2015  . Zetia [ezetimibe] Other (See Comments) 04/29/2015    Family History  Problem Relation Age of Onset  . Endometrial cancer Mother        died old age, 44  . Lung cancer Father   . Colon cancer Neg Hx   . Stomach cancer Neg Hx   . Pancreatic cancer Neg Hx   . Inflammatory bowel disease Neg Hx     Social History   Socioeconomic History  . Marital status: Divorced    Spouse name: Not on file  . Number of children: 2  . Years of education: Not on file  . Highest education level: Not on file  Occupational History  . Not on file  Tobacco Use  . Smoking status: Never Smoker  . Smokeless tobacco: Never Used  Vaping Use  . Vaping Use: Never used  Substance and Sexual Activity  . Alcohol use: No    Alcohol/week: 0.0 standard drinks  . Drug use: No  . Sexual activity: Not Currently    Birth control/protection: Surgical  Other Topics Concern   . Not on file  Social History Narrative  . Not on file   Social Determinants of Health   Financial Resource Strain: Low Risk   . Difficulty of Paying Living Expenses: Not very hard  Food Insecurity: No Food Insecurity  . Worried About Programme researcher, broadcasting/film/video in the Last Year: Never true  . Ran Out of Food in the Last Year: Never true  Transportation Needs: No Transportation Needs  . Lack of Transportation (Medical): No  . Lack of Transportation (Non-Medical): No  Physical Activity: Insufficiently Active  . Days of Exercise per Week: 4 days  . Minutes of Exercise per Session: 20 min  Stress: No Stress Concern Present  . Feeling of Stress : Not at all  Social Connections:  Moderately Isolated  . Frequency of Communication with Friends and Family: More than three times a week  . Frequency of Social Gatherings with Friends and Family: More than three times a week  . Attends Religious Services: Never  . Active Member of Clubs or Organizations: No  . Attends Banker Meetings: Never  . Marital Status: Married    Review of Systems: Gen: Denies fever, chills, anorexia. Denies fatigue, weakness, weight loss.  CV: Denies chest pain, palpitations, syncope, peripheral edema, and claudication. Resp: Denies dyspnea at rest, cough, wheezing, coughing up blood, and pleurisy. GI: see HPI Derm: Denies rash, itching, dry skin Psych: Denies depression, anxiety, memory loss, confusion. No homicidal or suicidal ideation.  Heme: Denies bruising, bleeding, and enlarged lymph nodes.  Physical Exam: BP 132/72   Pulse 63   Temp (!) 97.1 F (36.2 C)   Ht 5\' 5"  (1.651 m)   Wt 153 lb 9.6 oz (69.7 kg)   BMI 25.56 kg/m  General:   Alert and oriented. No distress noted. Pleasant and cooperative.  Head:  Normocephalic and atraumatic. Eyes:  Conjuctiva clear without scleral icterus. Mouth:  Mask in place Abdomen:  +BS, soft, non-tender and non-distended. No rebound or guarding. No HSM or  masses noted. Msk:  Symmetrical without gross deformities. Normal posture. Extremities:  Without edema. Neurologic:  Alert and  oriented x4 Psych:  Alert and cooperative. Normal mood and affect.  ASSESSMENT: Charlotte Woods is a 75 y.o. female presenting today with a history of GERD, normocytic anemia with low iron but normal ferritin, diarrhea that has resolved after stopping omeprazole, and weight loss.   GERD: currently not on a PPI. Will start Protonix now. Avoid omeprazole going forward due to diarrhea.   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 Diarrhea now resolved. Needs updated CBC and iron studies.  May need capsule.   Weight loss: with diarrhea, now resolved and staying in the 150s for several months.    PLAN:  Add Protonix daily Recheck CBC, iron studies Avoid NSAIDs Return in 3 months for weight check  June 2021, PhD, [facility]

## 2020-06-15 ENCOUNTER — Encounter: Payer: Self-pay | Admitting: Gastroenterology

## 2020-06-16 ENCOUNTER — Encounter: Payer: Self-pay | Admitting: Gastroenterology

## 2020-06-16 ENCOUNTER — Telehealth: Payer: Self-pay | Admitting: Gastroenterology

## 2020-06-16 NOTE — Telephone Encounter (Signed)
Helmut Muster,  Can we have patient update CBC and iron studies? I mean to order when I saw her last week. Reason: IDA

## 2020-06-16 NOTE — Progress Notes (Signed)
Cc'ed to pcp °

## 2020-06-17 NOTE — Telephone Encounter (Signed)
Lmom, waiting on a return call.  

## 2020-06-18 ENCOUNTER — Other Ambulatory Visit: Payer: Self-pay

## 2020-06-18 DIAGNOSIS — R634 Abnormal weight loss: Secondary | ICD-10-CM

## 2020-06-18 DIAGNOSIS — Z79899 Other long term (current) drug therapy: Secondary | ICD-10-CM

## 2020-06-18 NOTE — Telephone Encounter (Signed)
Spoke with pt. Pt will complete labs. Lab orders mailed to pt.

## 2020-07-13 ENCOUNTER — Telehealth: Payer: Self-pay

## 2020-07-13 MED ORDER — PANTOPRAZOLE SODIUM 40 MG PO TBEC
40.0000 mg | DELAYED_RELEASE_TABLET | Freq: Every day | ORAL | 3 refills | Status: DC
Start: 2020-07-13 — End: 2020-09-08

## 2020-07-13 NOTE — Telephone Encounter (Signed)
Done

## 2020-07-13 NOTE — Addendum Note (Signed)
Addended by: Gelene Mink on: 07/13/2020 11:28 AM   Modules accepted: Orders

## 2020-07-13 NOTE — Telephone Encounter (Signed)
Spoke with April from [facility] wants you to resend the pt's Rx to [facility] @ [phone] for Pantoprazole. You did it in February but sent to it [facility] instead.

## 2020-09-08 ENCOUNTER — Ambulatory Visit (INDEPENDENT_AMBULATORY_CARE_PROVIDER_SITE_OTHER): Payer: Medicare Other | Admitting: Gastroenterology

## 2020-09-08 VITALS — BP 119/72 | HR 82 | Temp 97.5°F | Ht 64.5 in | Wt 160.6 lb

## 2020-09-08 DIAGNOSIS — K219 Gastro-esophageal reflux disease without esophagitis: Secondary | ICD-10-CM

## 2020-09-08 DIAGNOSIS — D649 Anemia, unspecified: Secondary | ICD-10-CM | POA: Diagnosis not present

## 2020-09-08 MED ORDER — PANTOPRAZOLE SODIUM 40 MG PO TBEC
40.0000 mg | DELAYED_RELEASE_TABLET | Freq: Every day | ORAL | 3 refills | Status: DC
Start: 2020-09-08 — End: 2021-03-09

## 2020-09-08 NOTE — Patient Instructions (Signed)
I am glad you are doing well!  I have refilled Protonix for you.  Please call if further weight loss, otherwise we will see you in 6 months!  I enjoyed seeing you again today! As you know, I value our relationship and want to provide genuine, compassionate, and quality care. I welcome your feedback. If you receive a survey regarding your visit,  I greatly appreciate you taking time to fill this out. See you next time!  Gelene Mink, PhD, ANP-BC [facility]

## 2020-09-08 NOTE — Progress Notes (Signed)
Referring Provider: Altamease Oiler, FNP Primary Care Physician:  Tanna Furry, MD Primary GI: Dr. Marletta Lor  Chief Complaint  Patient presents with  . Weight Loss    F/u; has gained 7 lbs    HPI:   Charlotte Woods is a 75 y.o. female presenting today with a history of GERD, last EGD May 2021 with normal exam s/p esophageal dilation,  diarrhea Nov 2021 and undergoing colonoscopy with  non-bleeding internal hemorrhoids. Sigmoid, descending, and transverse colon diverticulosis. Patchy severe inflammation found in ascending colon and cecum secondary to ?ischemic colitis. Path with ulceration with inflamed granulation tissue. Likely NSAID related. Diarrhea resolved after stopping omeprazole.   GERD: stopped omeprazole due to diarrhea. Pantoprazole working well for patient. No dysphagia.   Normocytic anemia: need updated labs. No overt FI Bleeding.   Weight loss had been noted at last visit but now improved. Good appetite. No abdominal pain. No N/V.   Past Medical History:  Diagnosis Date  . Anxiety   . Arthritis   . GERD (gastroesophageal reflux disease)   . Heart attack [facility]) 2010   [facility] Dr. Scotty Court, silent  . HTN (hypertension)   . Interstitial cystitis   . PONV (postoperative nausea and vomiting)     Past Surgical History:  Procedure Laterality Date  . APPENDECTOMY    . BIOPSY  03/08/2020   Procedure: BIOPSY;  Surgeon: Lanelle Bal, DO;  [facility];  Service: Endoscopy;;  . carpel tunnel    . CHOLECYSTECTOMY    . COLONOSCOPY  10/2013   Dr. Aleene Davidson: sigmoid diverticulosis  . COLONOSCOPY N/A 05/19/2015   Procedure: COLONOSCOPY;  Surgeon: Gerrit Friends Rourk, internal hemorrhoids, colonic diverticulosis.  Suspected patient experienced hemorrhoidal or diverticular bleed.  Recommended repeat colonoscopy in 10 years.  . COLONOSCOPY WITH PROPOFOL N/A 03/08/2020   non-bleeding internal hemorrhoids. Sigmoid, descending, and transverse colon diverticulosis.  Patchy severe inflammation found in ascending colon and cecum secondary to ?ischemic colitis. Path with ulceration with inflamed granulation tissue. Likely NSAID related.   . ESOPHAGOGASTRODUODENOSCOPY N/A 05/19/2015   Procedure: ESOPHAGOGASTRODUODENOSCOPY (EGD);  Surgeon: Corbin Ade, MD; normal exam.  . ESOPHAGOGASTRODUODENOSCOPY (EGD) WITH PROPOFOL N/A 08/25/2019   normal exam s/p esophageal dilation  . hysterectomy  2009   complete. endometrial cancer  . MALONEY DILATION N/A 08/25/2019   Procedure: Elease Hashimoto DILATION;  Surgeon: Corbin Ade, MD;  [facility];  Service: Endoscopy;  Laterality: N/A;  . TONSILLECTOMY      Current Outpatient Medications  Medication Sig Dispense Refill  . aspirin 81 MG tablet Take 81 mg by mouth daily.    . Cyanocobalamin (B-12 PO) Take 1 tablet by mouth daily.     Marland Kitchen ELMIRON 100 MG capsule Take 100 mg by mouth 3 (three) times daily. Reported on 05/12/2015    . GARLIC PO Take 1 tablet by mouth daily.     . Ginger, Zingiber officinalis, (GINGER ROOT) 550 MG CAPS Take 1 capsule by mouth daily.     . Glucosamine 500 MG CAPS Take 1,000 mg by mouth in the morning and at bedtime.     . hydrOXYzine (ATARAX/VISTARIL) 25 MG tablet Take 25 mg by mouth daily.     Marland Kitchen levothyroxine (SYNTHROID, LEVOTHROID) 50 MCG tablet Take 1 tablet by mouth daily.  0  . Multiple Vitamin (MULTIVITAMIN) tablet Take 1 tablet by mouth every other day.    Marland Kitchen MYRBETRIQ 25 MG TB24 tablet Take 25 mg by mouth daily.  2  . Omega-3 Fatty Acids (FISH OIL) 1200 MG CAPS Take 1,200 mg by mouth daily.     . ondansetron (ZOFRAN) 4 MG tablet Take 1 tablet (4 mg total) by mouth every 8 (eight) hours as needed for nausea or vomiting. 30 tablet 2  . pantoprazole (PROTONIX) 40 MG tablet Take 1 tablet (40 mg total) by mouth daily. 90 tablet 3  . sertraline (ZOLOFT) 50 MG tablet Take 2 tablets by mouth daily.     Marland Kitchen dicyclomine (BENTYL) 10 MG capsule Take 1 capsule (10 mg total) by mouth 4 (four) times  daily -  before meals and at bedtime for 14 days. (Patient not taking: No sig reported) 56 capsule 0   No current facility-administered medications for this visit.    Allergies as of 09/08/2020 - Review Complete 09/08/2020  Allergen Reaction Noted  . Codeine Other (See Comments) 04/29/2015  . Penicillins Hives 04/29/2015  . Phenazopyridine Hives 04/29/2015  . Pravastatin Other (See Comments) 04/29/2015  . Septra [sulfamethoxazole-trimethoprim] Hives 04/29/2015  . Simvastatin Other (See Comments) 04/29/2015  . Zetia [ezetimibe] Other (See Comments) 04/29/2015    Family History  Problem Relation Age of Onset  . Endometrial cancer Mother        died old age, 25  . Lung cancer Father   . Colon cancer Neg Hx   . Stomach cancer Neg Hx   . Pancreatic cancer Neg Hx   . Inflammatory bowel disease Neg Hx     Social History   Socioeconomic History  . Marital status: Divorced    Spouse name: Not on file  . Number of children: 2  . Years of education: Not on file  . Highest education level: Not on file  Occupational History  . Not on file  Tobacco Use  . Smoking status: Never Smoker  . Smokeless tobacco: Never Used  Vaping Use  . Vaping Use: Never used  Substance and Sexual Activity  . Alcohol use: No    Alcohol/week: 0.0 standard drinks  . Drug use: No  . Sexual activity: Not Currently    Birth control/protection: Surgical  Other Topics Concern  . Not on file  Social History Narrative  . Not on file   Social Determinants of Health   Financial Resource Strain: Low Risk   . Difficulty of Paying Living Expenses: Not very hard  Food Insecurity: No Food Insecurity  . Worried About Programme researcher, broadcasting/film/video in the Last Year: Never true  . Ran Out of Food in the Last Year: Never true  Transportation Needs: No Transportation Needs  . Lack of Transportation (Medical): No  . Lack of Transportation (Non-Medical): No  Physical Activity: Insufficiently Active  . Days of Exercise per  Week: 4 days  . Minutes of Exercise per Session: 20 min  Stress: No Stress Concern Present  . Feeling of Stress : Not at all  Social Connections: Moderately Isolated  . Frequency of Communication with Friends and Family: More than three times a week  . Frequency of Social Gatherings with Friends and Family: More than three times a week  . Attends Religious Services: Never  . Active Member of Clubs or Organizations: No  . Attends Banker Meetings: Never  . Marital Status: Married    Review of Systems: Gen: Denies fever, chills, anorexia. Denies fatigue, weakness, weight loss.  CV: Denies chest pain, palpitations, syncope, peripheral edema, and claudication. Resp: Denies dyspnea at rest, cough, wheezing, coughing up blood, and pleurisy. GI:  see HPI Derm: Denies rash, itching, dry skin Psych: Denies depression, anxiety, memory loss, confusion. No homicidal or suicidal ideation.  Heme: Denies bruising, bleeding, and enlarged lymph nodes.  Physical Exam: BP 119/72   Pulse 82   Temp (!) 97.5 F (36.4 C) (Temporal)   Ht 5' 4.5" (1.638 m)   Wt 160 lb 9.6 oz (72.8 kg)   BMI 27.14 kg/m  General:   Alert and oriented. No distress noted. Pleasant and cooperative.  Head:  Normocephalic and atraumatic. Eyes:  Conjuctiva clear without scleral icterus. Mouth:  Mask in place Abdomen:  +BS, soft, non-tender and non-distended. No rebound or guarding. No HSM or masses noted. Msk:  Symmetrical without gross deformities. Normal posture. Extremities:  Without edema. Neurologic:  Alert and  oriented x4 Psych:  Alert and cooperative. Normal mood and affect.  ASSESSMENT: Charlotte Woods is a 75 y.o. female presenting today with a history of GERD, last EGD May 2021 with normal exam s/p esophageal dilation,  diarrhea Nov 2021 and undergoing colonoscopy with  non-bleeding internal hemorrhoids. Sigmoid, descending, and transverse colon diverticulosis. Patchy severe inflammation found in  ascending colon and cecum secondary to ?ischemic colitis. Path with ulceration with inflamed granulation tissue. Likely NSAID related. Diarrhea resolved after stopping omeprazole.   GERD: doing well on pantoprazole without alarm signs symptoms. I have refilled this for her.  Normocytic anemia: I have requested CBC and iron studies to be completed today. Without overt GI bleeding.   Weight loss now resolved.    PLAN:  Continue Protonix once daily Check CBC and iron studies Call if further weight loss Return in 6 months  Gelene Mink, PhD, [facility]

## 2020-09-28 ENCOUNTER — Other Ambulatory Visit (HOSPITAL_COMMUNITY): Payer: Self-pay | Admitting: Emergency Medicine

## 2020-09-28 ENCOUNTER — Other Ambulatory Visit (HOSPITAL_COMMUNITY): Payer: Self-pay | Admitting: Family Medicine

## 2020-09-28 DIAGNOSIS — Z1231 Encounter for screening mammogram for malignant neoplasm of breast: Secondary | ICD-10-CM

## 2020-10-14 ENCOUNTER — Ambulatory Visit (HOSPITAL_COMMUNITY): Payer: Medicare Other

## 2020-10-21 ENCOUNTER — Other Ambulatory Visit: Payer: Self-pay

## 2020-10-21 ENCOUNTER — Ambulatory Visit (HOSPITAL_COMMUNITY)
Admission: RE | Admit: 2020-10-21 | Discharge: 2020-10-21 | Disposition: A | Discharge status: Home / Self Care | Payer: Medicare Other | Source: Ambulatory Visit | Attending: Emergency Medicine | Admitting: Emergency Medicine

## 2020-10-21 DIAGNOSIS — Z1231 Encounter for screening mammogram for malignant neoplasm of breast: Secondary | ICD-10-CM | POA: Insufficient documentation

## 2021-01-28 LAB — CBC WITH DIFFERENTIAL/PLATELET
Absolute Monocytes: 511 cells/uL (ref 200–950)
Basophils Absolute: 90 cells/uL (ref 0–200)
Basophils Relative: 1.3 %
Eosinophils Absolute: 407 cells/uL (ref 15–500)
Eosinophils Relative: 5.9 %
HCT: 40.6 % (ref 35.0–45.0)
Hemoglobin: 13.1 g/dL (ref 11.7–15.5)
Lymphs Abs: 2470 cells/uL (ref 850–3900)
MCH: 27.8 pg (ref 27.0–33.0)
MCHC: 32.3 g/dL (ref 32.0–36.0)
MCV: 86 fL (ref 80.0–100.0)
MPV: 10.1 fL (ref 7.5–12.5)
Monocytes Relative: 7.4 %
Neutro Abs: 3422 cells/uL (ref 1500–7800)
Neutrophils Relative %: 49.6 %
Platelets: 303 10*3/uL (ref 140–400)
RBC: 4.72 10*6/uL (ref 3.80–5.10)
RDW: 14.4 % (ref 11.0–15.0)
Total Lymphocyte: 35.8 %
WBC: 6.9 10*3/uL (ref 3.8–10.8)

## 2021-01-28 LAB — IRON,TIBC AND FERRITIN PANEL
%SAT: 21 % (calc) (ref 16–45)
Ferritin: 172 ng/mL (ref 16–288)
Iron: 73 ug/dL (ref 45–160)
TIBC: 346 mcg/dL (calc) (ref 250–450)

## 2021-02-22 ENCOUNTER — Other Ambulatory Visit (HOSPITAL_BASED_OUTPATIENT_CLINIC_OR_DEPARTMENT_OTHER): Payer: Self-pay

## 2021-02-22 DIAGNOSIS — R0681 Apnea, not elsewhere classified: Secondary | ICD-10-CM

## 2021-02-22 DIAGNOSIS — R5383 Other fatigue: Secondary | ICD-10-CM

## 2021-02-22 DIAGNOSIS — R0683 Snoring: Secondary | ICD-10-CM

## 2021-02-22 DIAGNOSIS — G4710 Hypersomnia, unspecified: Secondary | ICD-10-CM

## 2021-03-09 ENCOUNTER — Ambulatory Visit (INDEPENDENT_AMBULATORY_CARE_PROVIDER_SITE_OTHER): Payer: Medicare Other | Admitting: Gastroenterology

## 2021-03-09 ENCOUNTER — Other Ambulatory Visit: Payer: Self-pay

## 2021-03-09 ENCOUNTER — Encounter: Payer: Self-pay | Admitting: Gastroenterology

## 2021-03-09 VITALS — BP 133/72 | HR 76 | Temp 97.3°F | Ht 65.0 in | Wt 160.6 lb

## 2021-03-09 DIAGNOSIS — R112 Nausea with vomiting, unspecified: Secondary | ICD-10-CM

## 2021-03-09 DIAGNOSIS — K219 Gastro-esophageal reflux disease without esophagitis: Secondary | ICD-10-CM | POA: Diagnosis not present

## 2021-03-09 MED ORDER — ONDANSETRON HCL 4 MG PO TABS: 4.0000 mg | ORAL_TABLET | Freq: Three times a day (TID) | ORAL | 2 refills | Status: AC | PRN

## 2021-03-09 MED ORDER — PANTOPRAZOLE SODIUM 40 MG PO TBEC
40.0000 mg | DELAYED_RELEASE_TABLET | Freq: Every day | ORAL | 3 refills | Status: DC
Start: 2021-03-09 — End: 2022-01-11

## 2021-03-09 NOTE — Patient Instructions (Signed)
You can stop iron. We will recheck blood work in 3 months.  Continue pantoprazole daily. I refilled Zofran for you to have as needed!  Please call with any concerns!  Enjoy the holiday season!  We will see you in 1 year!  I enjoyed seeing you again today! As you know, I value our relationship and want to provide genuine, compassionate, and quality care. I welcome your feedback. If you receive a survey regarding your visit,  I greatly appreciate you taking time to fill this out. See you next time!  Gelene Mink, PhD, ANP-BC [facility]

## 2021-03-09 NOTE — Progress Notes (Signed)
Referring Provider: Alfonse Flavors Primary Care Physician:  Alfonse Flavors, MD Primary GI: Dr. Abbey Chatters   Chief Complaint  Patient presents with   Gastroesophageal Reflux    Ok. Wants zofran refill    HPI:   Charlotte Woods is a 75 y.o. female presenting today with a history of GERD, last EGD May 2021 with normal exam s/p esophageal dilation,  diarrhea Nov 2021 and undergoing colonoscopy with  non-bleeding internal hemorrhoids. Sigmoid, descending, and transverse colon diverticulosis. Patchy severe inflammation found in ascending colon and cecum secondary to ?ischemic colitis. Path with ulceration with inflamed granulation tissue. Likely NSAID related. Diarrhea resolved after stopping omeprazole. Most recent CBC normal. No IDA.   GERD: taking pantoprazole daily. No dysphagia. No abdominal pain. Rare nausea at times, which is resolved with Zofran.  History of low iron but normal ferritin last year. Has been on iron. CBC and iron studies most recently normal in Oct 2022.     Past Medical History:  Diagnosis Date   Anxiety    Arthritis    GERD (gastroesophageal reflux disease)    Heart attack [facility]) 2010   [facility] Dr. Joni Fears, silent   HTN (hypertension)    Interstitial cystitis    PONV (postoperative nausea and vomiting)     Past Surgical History:  Procedure Laterality Date   APPENDECTOMY     BIOPSY  03/08/2020   Procedure: BIOPSY;  Surgeon: Eloise Harman, DO;  [facility];  Service: Endoscopy;;   carpel tunnel     CHOLECYSTECTOMY     COLONOSCOPY  10/2013   Dr. West Carbo: sigmoid diverticulosis   COLONOSCOPY N/A 05/19/2015   Procedure: COLONOSCOPY;  Surgeon: Cristopher Estimable Rourk, internal hemorrhoids, colonic diverticulosis.  Suspected patient experienced hemorrhoidal or diverticular bleed.  Recommended repeat colonoscopy in 10 years.   COLONOSCOPY WITH PROPOFOL N/A 03/08/2020   non-bleeding internal hemorrhoids. Sigmoid, descending, and transverse  colon diverticulosis. Patchy severe inflammation found in ascending colon and cecum secondary to ?ischemic colitis. Path with ulceration with inflamed granulation tissue. Likely NSAID related.    ESOPHAGOGASTRODUODENOSCOPY N/A 05/19/2015   Procedure: ESOPHAGOGASTRODUODENOSCOPY (EGD);  Surgeon: Daneil Dolin, MD; normal exam.   ESOPHAGOGASTRODUODENOSCOPY (EGD) WITH PROPOFOL N/A 08/25/2019   normal exam s/p esophageal dilation   hysterectomy  2009   complete. endometrial cancer   MALONEY DILATION N/A 08/25/2019   Procedure: Venia Minks DILATION;  Surgeon: Daneil Dolin, MD;  [facility];  Service: Endoscopy;  Laterality: N/A;   TONSILLECTOMY      Current Outpatient Medications  Medication Sig Dispense Refill   aspirin 81 MG tablet Take 81 mg by mouth daily.     Cyanocobalamin (B-12 PO) Take 1 tablet by mouth daily.      ELMIRON 100 MG capsule Take 100 mg by mouth 3 (three) times daily. Reported on [id number]     GARLIC PO Take 1 tablet by mouth daily.      Ginger, Zingiber officinalis, (GINGER ROOT) 550 MG CAPS Take 1 capsule by mouth daily.      Glucosamine 500 MG CAPS Take 1,000 mg by mouth in the morning and at bedtime.      hydrOXYzine (ATARAX/VISTARIL) 25 MG tablet Take 25 mg by mouth daily.      levothyroxine (SYNTHROID, LEVOTHROID) 50 MCG tablet Take 1 tablet by mouth daily.  0   Multiple Vitamin (MULTIVITAMIN) tablet Take 1 tablet by mouth every other day.     MYRBETRIQ 25 MG TB24 tablet Take 25  mg by mouth daily.  2   Omega-3 Fatty Acids (FISH OIL) 1200 MG CAPS Take 1,200 mg by mouth daily.      ondansetron (ZOFRAN) 4 MG tablet Take 1 tablet (4 mg total) by mouth every 8 (eight) hours as needed for nausea or vomiting. 30 tablet 2   pantoprazole (PROTONIX) 40 MG tablet Take 1 tablet (40 mg total) by mouth daily. 90 tablet 3   sertraline (ZOLOFT) 50 MG tablet Take 2 tablets by mouth daily.      dicyclomine (BENTYL) 10 MG capsule Take 1 capsule (10 mg total) by mouth 4 (four)  times daily -  before meals and at bedtime for 14 days. (Patient not taking: No sig reported) 56 capsule 0   No current facility-administered medications for this visit.    Allergies as of 03/09/2021 - Review Complete 03/09/2021  Allergen Reaction Noted   Codeine Other (See Comments) 04/29/2015   Penicillins Hives 04/29/2015   Phenazopyridine Hives 04/29/2015   Pravastatin Other (See Comments) 04/29/2015   Septra [sulfamethoxazole-trimethoprim] Hives 04/29/2015   Simvastatin Other (See Comments) 04/29/2015   Zetia [ezetimibe] Other (See Comments) 04/29/2015    Family History  Problem Relation Age of Onset   Endometrial cancer Mother        died old age, 60   Lung cancer Father    Colon cancer Neg Hx    Stomach cancer Neg Hx    Pancreatic cancer Neg Hx    Inflammatory bowel disease Neg Hx     Social History   Socioeconomic History   Marital status: Divorced    Spouse name: Not on file   Number of children: 2   Years of education: Not on file   Highest education level: Not on file  Occupational History   Not on file  Tobacco Use   Smoking status: Never   Smokeless tobacco: Never  Vaping Use   Vaping Use: Never used  Substance and Sexual Activity   Alcohol use: No    Alcohol/week: 0.0 standard drinks   Drug use: No   Sexual activity: Not Currently    Birth control/protection: Surgical  Other Topics Concern   Not on file  Social History Narrative   Not on file   Social Determinants of Health   Financial Resource Strain: Not on file  Food Insecurity: Not on file  Transportation Needs: Not on file  Physical Activity: Not on file  Stress: Not on file  Social Connections: Not on file    Review of Systems: Gen: Denies fever, chills, anorexia. Denies fatigue, weakness, weight loss.  CV: Denies chest pain, palpitations, syncope, peripheral edema, and claudication. Resp: Denies dyspnea at rest, cough, wheezing, coughing up blood, and pleurisy. GI: see HPI Derm:  Denies rash, itching, dry skin Psych: Denies depression, anxiety, memory loss, confusion. No homicidal or suicidal ideation.  Heme: Denies bruising, bleeding, and enlarged lymph nodes.  Physical Exam: BP 133/72   Pulse 76   Temp (!) 97.3 F (36.3 C) (Temporal)   Ht 5\' 5"  (1.651 m)   Wt 160 lb 9.6 oz (72.8 kg)   BMI 26.73 kg/m  General:   Alert and oriented. No distress noted. Pleasant and cooperative.  Head:  Normocephalic and atraumatic. Eyes:  Conjuctiva clear without scleral icterus. Mouth:  mask in place Abdomen:  +BS, soft, non-tender and non-distended. No rebound or guarding. No HSM or masses noted. Msk:  Symmetrical without gross deformities. Normal posture. Extremities:  Without edema. Neurologic:  Alert and  oriented x4 Psych:  Alert and cooperative. Normal mood and affect.  ASSESSMENT: Charlotte Woods is a 75 y.o. female presenting today in follow-up for GERD, normocytic anemia now resolved, and history of diarrhea that resolved after stopping omeprazole.  GERD: well managed on pantoprazole daily.   Diarrhea: resolved after stopping omeprazole.   History of  normocytic anemia: low iron but normal ferritin. CBC and iron studies most recently normal. EGD and colonoscopy on file. I have asked her to stop iron, and we will recheck CBC and iron studies in 3 months. If recurrent IDA, needs capsule.     PLAN:  Stop iron for now Continue PPI daily Recheck CBC and iron studies in 3 months. Capsule if recurrent anemia/IDA Return in 1 year or sooner if needed   Annitta Needs, PhD, ANP-BC [facility]

## 2021-03-14 ENCOUNTER — Ambulatory Visit: Payer: Medicare Other | Attending: Emergency Medicine | Admitting: Neurology

## 2021-03-14 DIAGNOSIS — R0683 Snoring: Secondary | ICD-10-CM | POA: Diagnosis present

## 2021-03-14 DIAGNOSIS — G4710 Hypersomnia, unspecified: Secondary | ICD-10-CM

## 2021-03-14 DIAGNOSIS — R5383 Other fatigue: Secondary | ICD-10-CM | POA: Diagnosis present

## 2021-03-14 DIAGNOSIS — G4733 Obstructive sleep apnea (adult) (pediatric): Secondary | ICD-10-CM | POA: Diagnosis not present

## 2021-03-14 DIAGNOSIS — R0681 Apnea, not elsewhere classified: Secondary | ICD-10-CM

## 2021-03-24 NOTE — Procedures (Signed)
[facility] Kariana Wiles A. Gerilyn Pilgrim, MD     [email or web address]             NOCTURNAL POLYSOMNOGRAPHY   LOCATION: [facility]  Patient Name: Charlotte Woods, Charlotte Woods Date: 03/14/2021 Gender: Female D.O.B: [birth year] Age (years): 79 Referring Provider: Smith Robert Height (inches): 64 Interpreting Physician: Beryle Beams MD, ABSM Weight (lbs): 156 RPSGT: Alfonso Ellis BMI: 27 MRN: [id number] Neck Size: 15.00 CLINICAL INFORMATION Sleep Study Type: NPSG     Indication for sleep study: Excessive Daytime Sleepiness, Snoring, Witnessed Apneas     Epworth Sleepiness Score: 9     SLEEP STUDY TECHNIQUE As per the AASM Manual for the Scoring of Sleep and Associated Events v2.3 (April 2016) with a hypopnea requiring 4% desaturations.  The channels recorded and monitored were frontal, central and occipital EEG, electrooculogram (EOG), submentalis EMG (chin), nasal and oral airflow, thoracic and abdominal wall motion, anterior tibialis EMG, snore microphone, electrocardiogram, and pulse oximetry.  MEDICATIONS Medications self-administered by patient taken the night of the study : N/A  Current Outpatient Medications:    aspirin 81 MG tablet, Take 81 mg by mouth daily., Disp: , Rfl:    Cyanocobalamin (B-12 PO), Take 1 tablet by mouth daily. , Disp: , Rfl:    dicyclomine (BENTYL) 10 MG capsule, Take 1 capsule (10 mg total) by mouth 4 (four) times daily -  before meals and at bedtime for 14 days. (Patient not taking: No sig reported), Disp: 56 capsule, Rfl: 0   ELMIRON 100 MG capsule, Take 100 mg by mouth 3 (three) times daily. Reported on 05/12/2015, Disp: , Rfl:    GARLIC PO, Take 1 tablet by mouth daily. , Disp: , Rfl:    Ginger, Zingiber officinalis, (GINGER ROOT) 550 MG CAPS, Take 1 capsule by mouth daily. , Disp: , Rfl:    Glucosamine 500 MG CAPS, Take 1,000 mg by mouth in the morning and at bedtime. , Disp: , Rfl:    hydrOXYzine (ATARAX/VISTARIL) 25 MG tablet, Take 25 mg by  mouth daily. , Disp: , Rfl:    levothyroxine (SYNTHROID, LEVOTHROID) 50 MCG tablet, Take 1 tablet by mouth daily., Disp: , Rfl: 0   Multiple Vitamin (MULTIVITAMIN) tablet, Take 1 tablet by mouth every other day., Disp: , Rfl:    MYRBETRIQ 25 MG TB24 tablet, Take 25 mg by mouth daily., Disp: , Rfl: 2   Omega-3 Fatty Acids (FISH OIL) 1200 MG CAPS, Take 1,200 mg by mouth daily. , Disp: , Rfl:    ondansetron (ZOFRAN) 4 MG tablet, Take 1 tablet (4 mg total) by mouth every 8 (eight) hours as needed for nausea or vomiting., Disp: 30 tablet, Rfl: 2   pantoprazole (PROTONIX) 40 MG tablet, Take 1 tablet (40 mg total) by mouth daily., Disp: 90 tablet, Rfl: 3   sertraline (ZOLOFT) 50 MG tablet, Take 2 tablets by mouth daily. , Disp: , Rfl:      SLEEP ARCHITECTURE The study was initiated at 11:11:32 PM and ended at 5:36:31 AM.  Sleep onset time was 7.8 minutes and the sleep efficiency was 87.1%. The total sleep time was 335.5 minutes.  Stage REM latency was 219.0 minutes.  The patient spent 5.51% of the night in stage N1 sleep, 61.70% in stage N2 sleep, 24.14% in stage N3 and 8.6% in REM.  Alpha intrusion was absent.  Supine sleep was 63.75%.  RESPIRATORY PARAMETERS The overall apnea/hypopnea index (AHI) was 27.7 per hour. There were 12 total apneas, including 12 obstructive, 0 central and 0 mixed  apneas. There were 143 hypopneas and 1 RERAs.  The AHI during Stage REM sleep was 6.2 per hour.  AHI while supine was 41.0 per hour.  The mean oxygen saturation was 92.73%. The minimum SpO2 during sleep was 82.00%.  loud snoring was noted during this study.  CARDIAC DATA The 2 lead EKG demonstrated sinus rhythm. The mean heart rate was 58.51 beats per minute. Other EKG findings include: None.  LEG MOVEMENT DATA The total PLMS were 0 with a resulting PLMS index of 0.00. Associated arousal with leg movement index was 0.0.  IMPRESSIONS Moderate obstructive sleep apnea is documented with this  recording.  Auto PAP 8-14 is recommended.   Argie Ramming, MD Diplomate, American Board of Sleep Medicine.  ELECTRONICALLY SIGNED ON:  03/24/2021, 6:12 PM [facility] PH: [phone]   FX: [phone] ACCREDITED BY THE AMERICAN ACADEMY OF SLEEP MEDICINE

## 2021-04-26 ENCOUNTER — Telehealth: Payer: Self-pay | Admitting: Gastroenterology

## 2021-04-26 NOTE — Telephone Encounter (Signed)
Charlotte Woods, Returned the pt's call and was advised that her acid reflux medication isn't working anymore. I asked her what symptoms was she having and she said every since her and her husband got sick (but fine now) she has had the sensation of fullness in the abdomen. I asked her was there any other symptoms and she said that was it except she feels so full she doesn't want to eat anything. I asked her was she sure she didn't have anything else besides that she advised no. No pain, no burping just the sensation of being full. Please advise.   Kristen,  I sent this to you in lieu of Anna's absent, but you seen pt for something different in the past. I think this can wait until tomorrow for Charlotte Woods to return. I advised the pt that Charlotte Woods was out today and it would probably be tomorrow when she returned.

## 2021-04-26 NOTE — Telephone Encounter (Signed)
PLEASE CALL PATIENT ABOUT HER REFLUX, HER MEDS ARE NOT WORKING ANYMORE

## 2021-04-27 NOTE — Telephone Encounter (Signed)
°  FYI: Phoned and spoke with pt and advised her of the note. She said she had actually got so weak yesterday that she went to ER in [place], [place]. The ran all kinds of test she said but she tested positive for the Flu. She said all that coughing she was doing was sloshing around in her belly making her have that fullness and indigestion. The pt expressed understanding of taking the OTC Pepcid in the evenings. She thanks [place] for calling and getting back with her.

## 2021-04-27 NOTE — Telephone Encounter (Signed)
If no pain, nausea, or vomiting, have her continue Protonix daily. Eat small, frequent meals (5-6 small ones each day instead of 3 big ones). If she notes worsening heartburn or reflux, can take Pepcid OTC in evenings.  What kind of illness is she referring to in her phone call?

## 2021-10-18 ENCOUNTER — Other Ambulatory Visit (HOSPITAL_COMMUNITY): Payer: Self-pay | Admitting: Emergency Medicine

## 2021-10-18 DIAGNOSIS — Z1231 Encounter for screening mammogram for malignant neoplasm of breast: Secondary | ICD-10-CM

## 2021-10-24 ENCOUNTER — Ambulatory Visit (HOSPITAL_COMMUNITY): Payer: Medicare Other

## 2021-11-02 ENCOUNTER — Ambulatory Visit (HOSPITAL_COMMUNITY)
Admission: RE | Admit: 2021-11-02 | Discharge: 2021-11-02 | Disposition: A | Discharge status: Home / Self Care | Payer: Medicare Other | Source: Ambulatory Visit | Attending: Emergency Medicine | Admitting: Emergency Medicine

## 2021-11-02 DIAGNOSIS — Z1231 Encounter for screening mammogram for malignant neoplasm of breast: Secondary | ICD-10-CM | POA: Insufficient documentation

## 2022-01-05 ENCOUNTER — Other Ambulatory Visit: Payer: Self-pay | Admitting: Gastroenterology

## 2022-04-19 ENCOUNTER — Ambulatory Visit (HOSPITAL_COMMUNITY)
Admission: RE | Admit: 2022-04-19 | Discharge: 2022-04-19 | Disposition: A | Discharge status: Home / Self Care | Payer: Medicare Other | Source: Ambulatory Visit | Attending: Internal Medicine | Admitting: Internal Medicine

## 2022-04-19 ENCOUNTER — Ambulatory Visit (INDEPENDENT_AMBULATORY_CARE_PROVIDER_SITE_OTHER): Payer: Medicare Other | Admitting: Internal Medicine

## 2022-04-19 ENCOUNTER — Encounter: Payer: Self-pay | Admitting: Internal Medicine

## 2022-04-19 VITALS — BP 132/68 | HR 72 | Temp 98.2°F | Ht 65.0 in | Wt 164.2 lb

## 2022-04-19 DIAGNOSIS — J942 Hemothorax: Secondary | ICD-10-CM | POA: Diagnosis present

## 2022-04-19 NOTE — Patient Instructions (Addendum)
Please remember to go to the  x-ray department  @  [facility] for your tests - we will call you with the results when they are available     Follow up is as needed

## 2022-04-19 NOTE — Progress Notes (Signed)
Charlotte Woods, female    DOB: March 04, 1946    MRN: [id number]   Brief patient profile:  34  yowf  never smoker no prior resp problems   referred to pulmonary clinic in [place]  04/19/2022 by Bernette Mayers  for L PTX p rib fx on Jan 29 2022 when fell in house bringing in a plant.     History of Present Illness  04/19/2022  Pulmonary/ 1st office eval/ Nadja Lina / [place] [facility]  Chief Complaint  Patient presents with   Consult    Collapsed lung in Oct this year- states she has had no issues since then with breathing or lungs.   Dyspnea:  limited by bilateral hip pain on injectioins q 3 m x 3-4 year in [place]  Cough: none  Sleep: cpap x sev months since   SABA use: none  02: none  Lung cancer screen: vax up to date.  No obvious day to day or daytime pattern/variability or assoc excess/ purulent sputum or mucus plugs or hemoptysis or cp or chest tightness, subjective wheeze or overt sinus or hb symptoms.   Sleeping  without nocturnal  or early am exacerbation  of respiratory  c/o's or need for noct saba. Also denies any obvious fluctuation of symptoms with weather or environmental changes or other aggravating or alleviating factors except as outlined above   No unusual exposure hx or h/o childhood pna/ asthma or knowledge of premature birth.  Current Allergies, Complete Past Medical History, Past Surgical History, Family History, and Social History were reviewed in [employer] record.  ROS  The following are not active complaints unless bolded Hoarseness, sore throat, dysphagia, dental problems, itching, sneezing,  nasal congestion or discharge of excess mucus or purulent secretions, ear ache,   fever, chills, sweats, unintended wt loss or wt gain, classically pleuritic or exertional cp,  orthopnea pnd or arm/hand swelling  or leg swelling, presyncope, palpitations, abdominal pain, anorexia, nausea, vomiting, diarrhea  or change in bowel habits or change in bladder habits,  change in stools or change in urine, dysuria, hematuria,  rash, arthralgias, visual complaints, headache, numbness, weakness or ataxia or problems with walking due to hip pain bilaterally  or coordination,  change in mood or  memory.           Past Medical History:  Diagnosis Date   Anxiety    Arthritis    GERD (gastroesophageal reflux disease)    Heart attack [facility]) 2010   [facility] [address], silent   HTN (hypertension)    Interstitial cystitis    PONV (postoperative nausea and vomiting)     Outpatient Medications Prior to Visit  Medication Sig Dispense Refill   aspirin 81 MG tablet Take 81 mg by mouth daily.     Cyanocobalamin (B-12 PO) Take 1 tablet by mouth daily.      ELMIRON 100 MG capsule Take 100 mg by mouth 3 (three) times daily. Reported on 05/12/2015     GARLIC PO Take 1 tablet by mouth daily.      Ginger, Zingiber officinalis, (GINGER ROOT) 550 MG CAPS Take 1 capsule by mouth daily.      Glucosamine 500 MG CAPS Take 1,000 mg by mouth in the morning and at bedtime.      hydrOXYzine (ATARAX/VISTARIL) 25 MG tablet Take 25 mg by mouth daily.      levothyroxine (SYNTHROID, LEVOTHROID) 50 MCG tablet Take 1 tablet by mouth daily.  0   Multiple Vitamin (MULTIVITAMIN)  tablet Take 1 tablet by mouth every other day.     MYRBETRIQ 25 MG TB24 tablet Take 25 mg by mouth daily.  2   Omega-3 Fatty Acids (FISH OIL) 1200 MG CAPS Take 1,200 mg by mouth daily.      ondansetron (ZOFRAN) 4 MG tablet Take 1 tablet (4 mg total) by mouth every 8 (eight) hours as needed for nausea or vomiting. 30 tablet 2   pantoprazole (PROTONIX) 40 MG tablet TAKE 1 TABLET EVERY DAY 90 tablet 3   sertraline (ZOLOFT) 50 MG tablet Take 2 tablets by mouth daily.      dicyclomine (BENTYL) 10 MG capsule Take 1 capsule (10 mg total) by mouth 4 (four) times daily -  before meals and at bedtime for 14 days. (Patient not taking: No sig reported) 56 capsule 0   No facility-administered medications prior to visit.      Objective:     BP 132/68   Pulse 72   Temp 98.2 F (36.8 C)   Ht 5\' 5"  (1.651 m)   Wt 164 lb 3.2 oz (74.5 kg)   SpO2 97% Comment: ra  BMI 27.32 kg/m   SpO2: 97 % (ra)  Amb pleasant wf nad    HEENT : Oropharynx  clear        NECK :  without  apparent JVD/ palpable Nodes/TM    LUNGS: no acc muscle use,  Nl contour chest which is clear to A and P bilaterally without cough on insp or exp maneuvers/ no rib deformity on L nor instability nor tenderness    CV:  RRR  no s3 or murmur or increase in P2, and no edema   ABD:  soft and nontender with nl inspiratory excursion in the supine position. No bruits or organomegaly appreciated   MS:  slt awkward gait/ ext warm without deformities Or obvious joint restrictions  calf tenderness, cyanosis or clubbing    SKIN: warm and dry without lesions    NEURO:  alert, approp, nl sensorium with  no motor or cerebellar deficits apparent.   CXR PA and Lateral:   04/19/2022 :    I personally reviewed images and impression is as follows:     Healed rib fx/ no ptx or effusion/ pleural scar    Assessment   Hemothorax on left Onset was Jan 29 2022 assoc with small PTX > both resolved as of 04/19/2022   Excellent response to conservative rx/ tincture of time > no pulmonary f/u needed          Each maintenance medication was reviewed in detail including emphasizing most importantly the difference between maintenance and prns and under what circumstances the prns are to be triggered using an action plan format where appropriate.  Total time for H and P, chart review, counseling,  and generating customized AVS unique to this office visit / same day charting = > 30 min           04/21/2022, MD 04/19/2022

## 2022-04-19 NOTE — Assessment & Plan Note (Signed)
Onset was Jan 29 2022 assoc with small PTX > both resolved as of 04/19/2022   Excellent response to conservative rx/ tincture of time > no pulmonary f/u needed          Each maintenance medication was reviewed in detail including emphasizing most importantly the difference between maintenance and prns and under what circumstances the prns are to be triggered using an action plan format where appropriate.  Total time for H and P, chart review, counseling,  and generating customized AVS unique to this office visit / same day charting = > 30 min

## 2022-04-26 ENCOUNTER — Telehealth: Payer: Self-pay | Admitting: Internal Medicine

## 2022-04-26 NOTE — Telephone Encounter (Signed)
Called and spoke with patient. Went over chest xray results. She verbalized understanding. Nothing further needed.

## 2022-04-26 NOTE — Telephone Encounter (Signed)
Patient stated she missed a call regarding some test results.  Patient would like a return call.  CB# [phone]

## 2022-10-17 ENCOUNTER — Other Ambulatory Visit (HOSPITAL_COMMUNITY): Payer: Self-pay | Admitting: Nurse Practitioner

## 2022-10-17 DIAGNOSIS — Z1231 Encounter for screening mammogram for malignant neoplasm of breast: Secondary | ICD-10-CM

## 2022-11-06 ENCOUNTER — Ambulatory Visit (HOSPITAL_COMMUNITY)
Admission: RE | Admit: 2022-11-06 | Discharge: 2022-11-06 | Disposition: A | Discharge status: Home / Self Care | Payer: Medicare Other | Source: Ambulatory Visit | Attending: Nurse Practitioner | Admitting: Nurse Practitioner

## 2022-11-06 ENCOUNTER — Encounter (HOSPITAL_COMMUNITY): Payer: Self-pay

## 2022-11-06 DIAGNOSIS — Z1231 Encounter for screening mammogram for malignant neoplasm of breast: Secondary | ICD-10-CM | POA: Diagnosis present

## 2023-01-13 ENCOUNTER — Other Ambulatory Visit: Payer: Self-pay | Admitting: Gastroenterology

## 2023-10-01 ENCOUNTER — Other Ambulatory Visit (HOSPITAL_COMMUNITY): Payer: Self-pay | Admitting: Nurse Practitioner

## 2023-10-01 DIAGNOSIS — Z1231 Encounter for screening mammogram for malignant neoplasm of breast: Secondary | ICD-10-CM

## 2023-11-12 ENCOUNTER — Ambulatory Visit (HOSPITAL_COMMUNITY)

## 2023-11-19 ENCOUNTER — Ambulatory Visit (HOSPITAL_COMMUNITY)
Admission: RE | Admit: 2023-11-19 | Discharge: 2023-11-19 | Disposition: A | Discharge status: Home / Self Care | Source: Ambulatory Visit | Attending: Nurse Practitioner | Admitting: Nurse Practitioner

## 2023-11-19 DIAGNOSIS — Z1231 Encounter for screening mammogram for malignant neoplasm of breast: Secondary | ICD-10-CM | POA: Diagnosis present

## 2023-11-27 DIAGNOSIS — I10 Essential (primary) hypertension: Secondary | ICD-10-CM | POA: Insufficient documentation

## 2023-11-27 DIAGNOSIS — F419 Anxiety disorder, unspecified: Secondary | ICD-10-CM | POA: Insufficient documentation

## 2023-11-27 DIAGNOSIS — E785 Hyperlipidemia, unspecified: Secondary | ICD-10-CM | POA: Insufficient documentation

## 2023-11-27 DIAGNOSIS — E663 Overweight: Secondary | ICD-10-CM | POA: Insufficient documentation

## 2023-11-27 DIAGNOSIS — Z90710 Acquired absence of both cervix and uterus: Secondary | ICD-10-CM | POA: Insufficient documentation

## 2023-11-27 DIAGNOSIS — E039 Hypothyroidism, unspecified: Secondary | ICD-10-CM | POA: Insufficient documentation

## 2023-11-27 DIAGNOSIS — F411 Generalized anxiety disorder: Secondary | ICD-10-CM | POA: Insufficient documentation

## 2023-11-27 DIAGNOSIS — E669 Obesity, unspecified: Secondary | ICD-10-CM | POA: Insufficient documentation

## 2023-11-27 DIAGNOSIS — N3020 Other chronic cystitis without hematuria: Secondary | ICD-10-CM | POA: Insufficient documentation

## 2023-11-27 DIAGNOSIS — I2510 Atherosclerotic heart disease of native coronary artery without angina pectoris: Secondary | ICD-10-CM | POA: Insufficient documentation

## 2023-11-27 DIAGNOSIS — M169 Osteoarthritis of hip, unspecified: Secondary | ICD-10-CM | POA: Insufficient documentation

## 2023-11-28 ENCOUNTER — Ambulatory Visit (INDEPENDENT_AMBULATORY_CARE_PROVIDER_SITE_OTHER): Admitting: Neurology

## 2023-11-28 ENCOUNTER — Encounter: Payer: Self-pay | Admitting: Neurology

## 2023-11-28 VITALS — BP 120/71 | HR 65 | Resp 17 | Ht 64.5 in | Wt 184.0 lb

## 2023-11-28 DIAGNOSIS — G459 Transient cerebral ischemic attack, unspecified: Secondary | ICD-10-CM | POA: Diagnosis not present

## 2023-11-28 DIAGNOSIS — R296 Repeated falls: Secondary | ICD-10-CM

## 2023-11-28 DIAGNOSIS — R799 Abnormal finding of blood chemistry, unspecified: Secondary | ICD-10-CM | POA: Diagnosis not present

## 2023-11-28 NOTE — Progress Notes (Unsigned)
 [facility]  PATIENT: Charlotte Woods DOB: 1946-01-28  REQUESTING CLINICIAN: Margarete Maeola DASEN, FNP HISTORY FROM: Patient/Husband  REASON FOR VISIT: Fall    HISTORICAL  CHIEF COMPLAINT:  Chief Complaint  Patient presents with   New Patient (Initial Visit)    Rm12, husband present, Referral from [facility]/Sherri Eagle NP [phone]/concern for TIA: recent fall 11/02/23 which is reason face is red.  Falls screening completed. Pt stated that she feels back to normal now     HISTORY OF PRESENT ILLNESS:  This is a 78 year old woman past medical history hypertension, hypothyroidism, anxiety depression who is presenting after fall.  Patient reports on July 11, she had a fall, face forward, she denies any prodrome prior to the fall, denies any trip and fall, and she denies any loss of consciousness.  She was taken to the hospital, her CT scan did not show any intracranial abnormality, she did have some scalp hematoma but no fractures.  She was discharged home.  She presented to PCP, has reported some family history of stroke and she was referred here for possible TIA.  Again she did not report any focal numbness, weakness, no change in vision, and no dizziness prior to the fall.   OTHER MEDICAL CONDITIONS: Hypertension, Hypothyroidism, Anxiety/Depression    REVIEW OF SYSTEMS: Full 14 system review of systems performed and negative with exception of: As noted in the HPI   ALLERGIES: Allergies  Allergen Reactions   Codeine Other (See Comments)    Hallucinations  Other Reaction(s): hallucinations, Not available, Unknown   Ezetimibe Other (See Comments)    Muscle cramps  Other Reaction(s): Not available, Unknown  ezetimibe   Nitrofurantoin     Other Reaction(s): Not available, vomiting   Penicillin G     Other Reaction(s): Unknown   Penicillin V     Other Reaction(s): Not available   Penicillins Hives and Dermatitis   Phenazopyridine Hives    Phenazopyridine Hcl     Other Reaction(s): not known   Pravastatin Other (See Comments)    Body aches  Other Reaction(s): Not available, Unknown   Pravastatin Sodium     Other Reaction(s): muscle aches   Simvastatin Other (See Comments)    Body aches  Other Reaction(s): muscle aches, Not available, Unknown   Statins     Other Reaction(s): Not available   Sulfa Antibiotics Dermatitis    Other Reaction(s): Not available  Other Reaction(s): Unknown   Sulfamethoxazole-Trimethoprim Hives    Other Reaction(s): Not available, Not available   Trimethoprim     Other Reaction(s): not known   Cephalexin Dermatitis and Rash    HOME MEDICATIONS: Outpatient Medications Prior to Visit  Medication Sig Dispense Refill   aspirin 81 MG tablet Take 81 mg by mouth daily.     Cyanocobalamin (B-12 PO) Take 1 tablet by mouth daily.      dicyclomine  (BENTYL ) 10 MG capsule Take 1 capsule (10 mg total) by mouth 4 (four) times daily -  before meals and at bedtime for 14 days. 56 capsule 0   GARLIC PO Take 1 tablet by mouth daily.      Ginger, Zingiber officinalis, (GINGER ROOT) 550 MG CAPS Take 1 capsule by mouth daily.      Glucosamine 500 MG CAPS Take 1,000 mg by mouth in the morning and at bedtime.      hydrOXYzine  (ATARAX /VISTARIL ) 25 MG tablet Take 25 mg by mouth daily.      levothyroxine  (SYNTHROID , LEVOTHROID) 50  MCG tablet Take 1 tablet by mouth daily.  0   Multiple Vitamin (MULTIVITAMIN) tablet Take 1 tablet by mouth every other day.     MYRBETRIQ  25 MG TB24 tablet Take 25 mg by mouth daily.  2   Omega-3 Fatty Acids (FISH OIL) 1200 MG CAPS Take 1,200 mg by mouth daily.      ondansetron  (ZOFRAN ) 4 MG tablet Take 1 tablet (4 mg total) by mouth every 8 (eight) hours as needed for nausea or vomiting. 30 tablet 2   pantoprazole  (PROTONIX ) 40 MG tablet TAKE 1 TABLET EVERY DAY 90 tablet 3   sertraline  (ZOLOFT ) 50 MG tablet Take 2 tablets by mouth daily.      ELMIRON 100 MG capsule Take 100 mg by  mouth 3 (three) times daily. Reported on 05/12/2015     No facility-administered medications prior to visit.    PAST MEDICAL HISTORY: Past Medical History:  Diagnosis Date   Anxiety    Arthritis    GERD (gastroesophageal reflux disease)    Heart attack [facility]) 2010   [facility] Dr[facility], silent   HTN (hypertension)    Interstitial cystitis    PONV (postoperative nausea and vomiting)     PAST SURGICAL HISTORY: Past Surgical History:  Procedure Laterality Date   APPENDECTOMY     BIOPSY  03/08/2020   Procedure: BIOPSY;  Surgeon: Cindie Carlin POUR, DO;  [facility];  Service: Endoscopy;;   carpel tunnel     CHOLECYSTECTOMY     COLONOSCOPY  10/2013   Dr. Rhoda: sigmoid diverticulosis   COLONOSCOPY N/A 05/19/2015   Procedure: COLONOSCOPY;  Surgeon: Lamar HERO Rourk, internal hemorrhoids, colonic diverticulosis.  Suspected patient experienced hemorrhoidal or diverticular bleed.  Recommended repeat colonoscopy in 10 years.   COLONOSCOPY WITH PROPOFOL  N/A 03/08/2020   non-bleeding internal hemorrhoids. Sigmoid, descending, and transverse colon diverticulosis. Patchy severe inflammation found in ascending colon and cecum secondary to ?ischemic colitis. Path with ulceration with inflamed granulation tissue. Likely NSAID related.    ESOPHAGOGASTRODUODENOSCOPY N/A 05/19/2015   Procedure: ESOPHAGOGASTRODUODENOSCOPY (EGD);  Surgeon: Lamar HERO Hollingshead, MD; normal exam.   ESOPHAGOGASTRODUODENOSCOPY (EGD) WITH PROPOFOL  N/A 08/25/2019   normal exam s/p esophageal dilation   hysterectomy  2009   complete. endometrial cancer   MALONEY DILATION N/A 08/25/2019   Procedure: AGAPITO DILATION;  Surgeon: Hollingshead Lamar HERO, MD;  Location: [facility];  Service: Endoscopy;  Laterality: N/A;   TONSILLECTOMY      FAMILY HISTORY: Family History  Problem Relation Age of Onset   Endometrial cancer Mother        died old age, 72   Lung cancer Father    Colon cancer Neg Hx    Stomach cancer Neg Hx     Pancreatic cancer Neg Hx    Inflammatory bowel disease Neg Hx     SOCIAL HISTORY: Social History   Socioeconomic History   Marital status: Divorced    Spouse name: Not on file   Number of children: 2   Years of education: Not on file   Highest education level: Not on file  Occupational History   Not on file  Tobacco Use   Smoking status: Never   Smokeless tobacco: Never  Vaping Use   Vaping status: Never Used  Substance and Sexual Activity   Alcohol use: No    Alcohol/week: 0.0 standard drinks of alcohol   Drug use: No   Sexual activity: Not Currently    Birth control/protection: Surgical  Other Topics Concern  Not on file  Social History Narrative   Not on file   Social Drivers of Health   Financial Resource Strain: Low Risk  (12/02/2019)   Overall Financial Resource Strain (CARDIA)    Difficulty of Paying Living Expenses: Not very hard  Food Insecurity: No Food Insecurity (12/02/2019)   Hunger Vital Sign    Worried About Running Out of Food in the Last Year: Never true    Ran Out of Food in the Last Year: Never true  Transportation Needs: No Transportation Needs (12/02/2019)   PRAPARE - Administrator, Civil Service (Medical): No    Lack of Transportation (Non-Medical): No  Physical Activity: Insufficiently Active (12/02/2019)   Exercise Vital Sign    Days of Exercise per Week: 4 days    Minutes of Exercise per Session: 20 min  Stress: No Stress Concern Present (12/02/2019)   [employer] of Occupational Health - Occupational Stress Questionnaire    Feeling of Stress : Not at all  Social Connections: Moderately Isolated (12/02/2019)   Social Connection and Isolation Panel    Frequency of Communication with Friends and Family: More than three times a week    Frequency of Social Gatherings with Friends and Family: More than three times a week    Attends Religious Services: Never    Database administrator or Organizations: No    Attends Tax inspector Meetings: Never    Marital Status: Married  Catering manager Violence: Not At Risk (12/02/2019)   Humiliation, Afraid, Rape, and Kick questionnaire    Fear of Current or Ex-Partner: No    Emotionally Abused: No    Physically Abused: No    Sexually Abused: No    PHYSICAL EXAM  GENERAL EXAM/CONSTITUTIONAL: Vitals:  Vitals:   11/28/23 1032  BP: 120/71  Pulse: 65  Resp: 17  SpO2: 96%  Weight: 184 lb (83.5 kg)  Height: 5' 4.5 (1.638 m)   Body mass index is 31.1 kg/m. Wt Readings from Last 3 Encounters:  11/28/23 184 lb (83.5 kg)  04/19/22 164 lb 3.2 oz (74.5 kg)  03/09/21 160 lb 9.6 oz (72.8 kg)   Patient is in no distress; well developed, nourished and groomed; neck is supple  MUSCULOSKELETAL: Gait, strength, tone, movements noted in Neurologic exam below  NEUROLOGIC: MENTAL STATUS:      No data to display         awake, alert, oriented to person, place and time recent and remote memory intact normal attention and concentration language fluent, comprehension intact, naming intact fund of knowledge appropriate  CRANIAL NERVE:  2nd, 3rd, 4th, 6th - Visual fields full to confrontation, extraocular muscles intact, no nystagmus 5th - facial sensation symmetric 7th - facial strength symmetric 8th - hearing intact 9th - palate elevates symmetrically, uvula midline 11th - shoulder shrug symmetric 12th - tongue protrusion midline  MOTOR:  normal bulk and tone, full strength in the BUE, BLE  SENSORY:  normal and symmetric to light touch  COORDINATION:  finger-nose-finger, fine finger movements normal  GAIT/STATION:  normal   DIAGNOSTIC DATA (LABS, IMAGING, TESTING) - I reviewed patient records, labs, notes, testing and imaging myself where available.  Lab Results  Component Value Date   WBC 6.9 01/27/2021   HGB 13.1 01/27/2021   HCT 40.6 01/27/2021   MCV 86.0 01/27/2021   PLT 303 01/27/2021      Component Value Date/Time   NA 143  02/28/2020 0403   K 4.3 02/28/2020 0403  CL 111 02/28/2020 0403   CO2 24 02/28/2020 0403   GLUCOSE 92 02/28/2020 0403   BUN 13 02/28/2020 0403   CREATININE 0.73 02/28/2020 0403   CREATININE 1.17 (H) 01/09/2020 1139   CALCIUM 8.8 (L) 02/28/2020 0403   PROT 5.3 (L) 02/26/2020 1901   ALBUMIN 2.5 (L) 02/26/2020 1901   AST 18 02/26/2020 1901   ALT 12 02/26/2020 1901   ALKPHOS 19 (L) 02/26/2020 1901   BILITOT 0.5 02/26/2020 1901   GFRNONAA >60 02/28/2020 0403   GFRNONAA 47 (L) 01/09/2020 1139   GFRAA 54 (L) 01/09/2020 1139   Lab Results  Component Value Date   CHOL 194 11/28/2023   HDL 49 11/28/2023   LDLCALC 124 (H) 11/28/2023   TRIG 118 11/28/2023   CHOLHDL 4.0 11/28/2023   Lab Results  Component Value Date   HGBA1C 5.5 11/28/2023   Lab Results  Component Value Date   VITAMINB12 751 02/27/2020   Lab Results  Component Value Date   TSH 2.12 01/09/2020    Head CT 11/02/2023 No acute intracranial abnormality Small left frontal scalp hematoma.  No underlying calvarial fracture.   ASSESSMENT AND PLAN  78 y.o. year old female with history of hypothyroidism, hypertension, anxiety depression who is presenting after a fall.  No prodrome, no trip and fall and no loss of consciousness.  Due to family history of stroke, will obtain MRI brain and stroke labs.  Patient is already on aspirin, advised her to continue with current medications and to continue follow-up with PCP.  Return as needed.   1. TIA (transient ischemic attack)   2. Frequent falls   3. Abnormal finding of blood chemistry, unspecified     Patient Instructions  Complete TIA workup with lipid panel, hemoglobin A1C Will also obtain MRI brain Carotid US   Patient is already on aspirin, continue current medications Follow-up PCP and return as needed  Orders Placed This Encounter  Procedures   MR BRAIN WO CONTRAST   Lipid Panel   Hemoglobin A1c   Ambulatory referral to Physical Therapy   VAS US  CAROTID     No orders of the defined types were placed in this encounter.   Return if symptoms worsen or fail to improve.    Pastor Falling, MD 11/29/2023, 12:55 PM  [facility] [address] [place], [place] [address] [phone]

## 2023-11-29 ENCOUNTER — Ambulatory Visit: Payer: Self-pay | Admitting: Neurology

## 2023-11-29 ENCOUNTER — Telehealth: Payer: Self-pay | Admitting: Neurology

## 2023-11-29 LAB — LIPID PANEL
Chol/HDL Ratio: 4 ratio (ref 0.0–4.4)
Cholesterol, Total: 194 mg/dL (ref 100–199)
HDL: 49 mg/dL (ref >39)
LDL Chol Calc (NIH): 124 mg/dL — ABNORMAL HIGH (ref 0–99)
Triglycerides: 118 mg/dL (ref 0–149)
VLDL Cholesterol Cal: 21 mg/dL (ref 5–40)

## 2023-11-29 LAB — HEMOGLOBIN A1C
Est. average glucose Bld gHb Est-mCnc: 111 mg/dL
Hgb A1c MFr Bld: 5.5 % (ref 4.8–5.6)

## 2023-11-29 NOTE — Progress Notes (Signed)
 Please call and inform patient that her LDL was elevated at 124. Since she has sensitivity to statin, she should talk to her doctor about Repatha. Her Hemoglobin A1C was normal. Please keep any upcoming appointments or tests and call us  with any interim questions, concerns, problems or updates. Thanks,   Pastor Falling, MD

## 2023-11-29 NOTE — Addendum Note (Signed)
 Addended byBETHA GREGG LEK on: 11/29/2023 12:55 PM   Modules accepted: Orders

## 2023-11-29 NOTE — Telephone Encounter (Signed)
 [facility] Phone: [phone] Fax; [phone]

## 2023-11-29 NOTE — Patient Instructions (Addendum)
 Complete TIA workup with lipid panel, hemoglobin A1C Will also obtain MRI brain Carotid US   Patient is already on aspirin, continue current medications Follow-up PCP and return as needed

## 2023-12-04 ENCOUNTER — Ambulatory Visit: Attending: Internal Medicine

## 2023-12-04 DIAGNOSIS — G459 Transient cerebral ischemic attack, unspecified: Secondary | ICD-10-CM | POA: Insufficient documentation

## 2023-12-06 ENCOUNTER — Ambulatory Visit
Admission: RE | Admit: 2023-12-06 | Discharge: 2023-12-06 | Disposition: A | Discharge status: Home / Self Care | Source: Ambulatory Visit | Attending: Neurology | Admitting: Neurology

## 2023-12-06 DIAGNOSIS — G459 Transient cerebral ischemic attack, unspecified: Secondary | ICD-10-CM

## 2023-12-06 NOTE — Progress Notes (Signed)
 Please call and advise the patient that the recent carotid US  did not show any severe narrowing.  No further action is required on these tests at this time. Please continue with aspirin with remind patient to keep any upcoming appointments or tests and to call us  with any interim questions, concerns, problems or updates. Thanks,   Pastor Falling, MD

## 2023-12-10 ENCOUNTER — Telehealth: Payer: Self-pay

## 2023-12-10 NOTE — Telephone Encounter (Signed)
 Pt has returned call to RN

## 2023-12-10 NOTE — Telephone Encounter (Signed)
 Call to patient to review preliminary result in Dr. Gregg absence, left meesage to call back

## 2023-12-17 ENCOUNTER — Telehealth: Payer: Self-pay

## 2023-12-17 NOTE — Telephone Encounter (Signed)
 Call to patient, reviewed results, verbalized understanding.

## 2023-12-17 NOTE — Progress Notes (Signed)
 Please call and advise the patient that the recent MRI Brain did not show any acute findings, such as a stroke, or mass or blood products. No further action is required on this test at this time. Please remind patient to keep any upcoming appointments or tests and to call us  with any interim questions, concerns, problems or updates. Thanks,   Pastor Falling, MD

## 2023-12-18 ENCOUNTER — Telehealth: Payer: Self-pay

## 2023-12-18 NOTE — Telephone Encounter (Signed)
 Faxed ppw to [facility].
